# Patient Record
Sex: Male | Born: 1940 | Race: Black or African American | Hispanic: No | Marital: Married | Smoking: Former smoker
Health system: Southern US, Community
[De-identification: ages and names within clinical notes are randomized; demographics above are authoritative.]

## PROBLEM LIST (undated history)

## (undated) DIAGNOSIS — R0600 Dyspnea, unspecified: Secondary | ICD-10-CM

## (undated) DIAGNOSIS — I10 Essential (primary) hypertension: Secondary | ICD-10-CM

## (undated) DIAGNOSIS — M1990 Unspecified osteoarthritis, unspecified site: Secondary | ICD-10-CM

## (undated) HISTORY — PX: HEMORROIDECTOMY: SUR656

---

## 2015-01-21 ENCOUNTER — Other Ambulatory Visit: Payer: Self-pay | Admitting: Internal Medicine

## 2015-01-21 ENCOUNTER — Ambulatory Visit
Admission: RE | Admit: 2015-01-21 | Discharge: 2015-01-21 | Disposition: A | Discharge status: Home / Self Care | Payer: No Typology Code available for payment source | Source: Ambulatory Visit | Attending: Internal Medicine | Admitting: Internal Medicine

## 2015-01-21 DIAGNOSIS — M25562 Pain in left knee: Secondary | ICD-10-CM

## 2016-11-01 ENCOUNTER — Inpatient Hospital Stay (HOSPITAL_COMMUNITY)
Admission: AD | Admit: 2016-11-01 | Discharge: 2016-11-09 | DRG: 247 | Disposition: A | Discharge status: Home / Self Care | Payer: Medicaid Other | Source: Ambulatory Visit | Attending: Cardiovascular Disease | Admitting: Cardiovascular Disease

## 2016-11-01 ENCOUNTER — Inpatient Hospital Stay (HOSPITAL_COMMUNITY): Payer: Medicaid Other

## 2016-11-01 DIAGNOSIS — R071 Chest pain on breathing: Secondary | ICD-10-CM

## 2016-11-01 DIAGNOSIS — J479 Bronchiectasis, uncomplicated: Secondary | ICD-10-CM | POA: Diagnosis present

## 2016-11-01 DIAGNOSIS — I472 Ventricular tachycardia: Secondary | ICD-10-CM | POA: Diagnosis present

## 2016-11-01 DIAGNOSIS — Z87891 Personal history of nicotine dependence: Secondary | ICD-10-CM

## 2016-11-01 DIAGNOSIS — I471 Supraventricular tachycardia: Secondary | ICD-10-CM | POA: Diagnosis present

## 2016-11-01 DIAGNOSIS — I77810 Thoracic aortic ectasia: Secondary | ICD-10-CM | POA: Diagnosis present

## 2016-11-01 DIAGNOSIS — I10 Essential (primary) hypertension: Secondary | ICD-10-CM | POA: Diagnosis present

## 2016-11-01 DIAGNOSIS — I2109 ST elevation (STEMI) myocardial infarction involving other coronary artery of anterior wall: Principal | ICD-10-CM | POA: Diagnosis present

## 2016-11-01 DIAGNOSIS — J84112 Idiopathic pulmonary fibrosis: Secondary | ICD-10-CM | POA: Diagnosis present

## 2016-11-01 DIAGNOSIS — J849 Interstitial pulmonary disease, unspecified: Secondary | ICD-10-CM

## 2016-11-01 DIAGNOSIS — M170 Bilateral primary osteoarthritis of knee: Secondary | ICD-10-CM | POA: Diagnosis present

## 2016-11-01 DIAGNOSIS — D62 Acute posthemorrhagic anemia: Secondary | ICD-10-CM | POA: Diagnosis not present

## 2016-11-01 DIAGNOSIS — I25110 Atherosclerotic heart disease of native coronary artery with unstable angina pectoris: Secondary | ICD-10-CM | POA: Diagnosis present

## 2016-11-01 DIAGNOSIS — I959 Hypotension, unspecified: Secondary | ICD-10-CM | POA: Diagnosis present

## 2016-11-01 DIAGNOSIS — R739 Hyperglycemia, unspecified: Secondary | ICD-10-CM | POA: Diagnosis present

## 2016-11-01 DIAGNOSIS — R079 Chest pain, unspecified: Secondary | ICD-10-CM | POA: Diagnosis present

## 2016-11-01 DIAGNOSIS — Z79899 Other long term (current) drug therapy: Secondary | ICD-10-CM

## 2016-11-01 DIAGNOSIS — D509 Iron deficiency anemia, unspecified: Secondary | ICD-10-CM | POA: Diagnosis present

## 2016-11-01 DIAGNOSIS — E871 Hypo-osmolality and hyponatremia: Secondary | ICD-10-CM | POA: Diagnosis present

## 2016-11-01 DIAGNOSIS — R0602 Shortness of breath: Secondary | ICD-10-CM

## 2016-11-01 DIAGNOSIS — E785 Hyperlipidemia, unspecified: Secondary | ICD-10-CM | POA: Diagnosis present

## 2016-11-01 HISTORY — DX: Unspecified osteoarthritis, unspecified site: M19.90

## 2016-11-01 HISTORY — DX: Essential (primary) hypertension: I10

## 2016-11-01 HISTORY — DX: Dyspnea, unspecified: R06.00

## 2016-11-01 LAB — D-DIMER, QUANTITATIVE (NOT AT ARMC): D DIMER QUANT: 1.2 ug{FEU}/mL — AB (ref 0.00–0.50)

## 2016-11-01 LAB — COMPREHENSIVE METABOLIC PANEL
ALK PHOS: 85 U/L (ref 38–126)
ALT: 10 U/L — AB (ref 17–63)
AST: 16 U/L (ref 15–41)
Albumin: 2.7 g/dL — ABNORMAL LOW (ref 3.5–5.0)
Anion gap: 9 (ref 5–15)
BUN: 34 mg/dL — ABNORMAL HIGH (ref 6–20)
CHLORIDE: 106 mmol/L (ref 101–111)
CO2: 24 mmol/L (ref 22–32)
Calcium: 8.5 mg/dL — ABNORMAL LOW (ref 8.9–10.3)
Creatinine, Ser: 1.36 mg/dL — ABNORMAL HIGH (ref 0.61–1.24)
GFR calc non Af Amer: 49 mL/min — ABNORMAL LOW (ref >60)
GFR, EST AFRICAN AMERICAN: 57 mL/min — AB (ref >60)
Glucose, Bld: 112 mg/dL — ABNORMAL HIGH (ref 65–99)
POTASSIUM: 3.5 mmol/L (ref 3.5–5.1)
SODIUM: 139 mmol/L (ref 135–145)
Total Bilirubin: 0.6 mg/dL (ref 0.3–1.2)
Total Protein: 6.3 g/dL — ABNORMAL LOW (ref 6.5–8.1)

## 2016-11-01 LAB — TROPONIN I

## 2016-11-01 MED ORDER — GEMFIBROZIL 600 MG PO TABS
600.0000 mg | ORAL_TABLET | Freq: Two times a day (BID) | ORAL | Status: DC
Start: 2016-11-02 — End: 2016-11-09
  Administered 2016-11-02 – 2016-11-09 (×14): 600 mg via ORAL
  Filled 2016-11-01 (×17): qty 1

## 2016-11-01 MED ORDER — TAMSULOSIN HCL 0.4 MG PO CAPS
0.4000 mg | ORAL_CAPSULE | Freq: Every day | ORAL | Status: DC
  Administered 2016-11-02 – 2016-11-07 (×5): 0.4 mg via ORAL
  Filled 2016-11-01 (×5): qty 1

## 2016-11-01 MED ORDER — HEPARIN (PORCINE) IN NACL 100-0.45 UNIT/ML-% IJ SOLN
1350.0000 [IU]/h | INTRAMUSCULAR | Status: DC
  Administered 2016-11-01: 1200 [IU]/h via INTRAVENOUS
  Administered 2016-11-02 – 2016-11-04 (×3): 1350 [IU]/h via INTRAVENOUS
  Filled 2016-11-01 (×4): qty 250

## 2016-11-01 MED ORDER — AMLODIPINE BESYLATE 10 MG PO TABS
10.0000 mg | ORAL_TABLET | Freq: Every day | ORAL | Status: DC
  Administered 2016-11-02 – 2016-11-03 (×2): 10 mg via ORAL
  Filled 2016-11-01 (×2): qty 1

## 2016-11-01 MED ORDER — NITROGLYCERIN 0.4 MG SL SUBL: 0.4000 mg | SUBLINGUAL_TABLET | SUBLINGUAL | Status: DC | PRN

## 2016-11-01 MED ORDER — ASPIRIN EC 81 MG PO TBEC
81.0000 mg | DELAYED_RELEASE_TABLET | Freq: Every day | ORAL | Status: DC
Start: 2016-11-02 — End: 2016-11-09
  Administered 2016-11-02 – 2016-11-09 (×8): 81 mg via ORAL
  Filled 2016-11-01 (×8): qty 1

## 2016-11-01 MED ORDER — ASPIRIN 81 MG PO CHEW
324.0000 mg | CHEWABLE_TABLET | ORAL | Status: AC
  Administered 2016-11-01: 324 mg via ORAL
  Filled 2016-11-01: qty 4

## 2016-11-01 MED ORDER — ACETAMINOPHEN 325 MG PO TABS
650.0000 mg | ORAL_TABLET | ORAL | Status: DC | PRN
  Administered 2016-11-02 – 2016-11-07 (×4): 650 mg via ORAL
  Filled 2016-11-01 (×4): qty 2

## 2016-11-01 MED ORDER — ONDANSETRON HCL 4 MG/2ML IJ SOLN
4.0000 mg | Freq: Four times a day (QID) | INTRAMUSCULAR | Status: DC | PRN
Start: 2016-11-01 — End: 2016-11-09

## 2016-11-01 MED ORDER — HEPARIN BOLUS VIA INFUSION
4000.0000 [IU] | Freq: Once | INTRAVENOUS | Status: AC
  Administered 2016-11-01: 4000 [IU] via INTRAVENOUS
  Filled 2016-11-01: qty 4000

## 2016-11-01 MED ORDER — GUAIFENESIN-DM 100-10 MG/5ML PO SYRP
5.0000 mL | ORAL_SOLUTION | ORAL | Status: DC | PRN
  Administered 2016-11-01 – 2016-11-08 (×20): 5 mL via ORAL
  Filled 2016-11-01 (×21): qty 5

## 2016-11-01 MED ORDER — ASPIRIN 300 MG RE SUPP
300.0000 mg | RECTAL | Status: AC
  Filled 2016-11-01: qty 1

## 2016-11-01 NOTE — H&P (Signed)
Referring Physician:  [name] is an 76 y.o. male.                       Chief Complaint: Chest pain and shortness of breath  HPI: 76 year old male has 2 month history of recurrent chest pain and shortness of breath on exertion. He was hypoxic on exertion in office with no fever but positive cough. He has remote history of tobacco use. He chewed tobbaco for 30 years and smoked cigarettes for 3-4 years, then quit tobacco use for last 10 years. He also has bilateral knee pain with known history of severe arthritis.  Past medical history: No diabetes mellitus, Positive Hypertension x 10 years. No MI. No stroke. No asthma. Positive bilateral knee arthritis, can not afford surgery due to lack of insurance.  Past surgical history: Left knee cyst removal from back many years ago.  Family history: Parents died of old age. He had 6 brothers and lost 5, one younger brother is living. He had one sister, not living.   Social History:  Married, has quit tobacco x 10 years. Had tobacco chewing x 30 years and smoking cigarettes x 3-4 years. No drug history.  Allergies: No Known Allergies  Medications Prior to Admission  Medication Sig Dispense Refill  . amLODipine (NORVASC) 10 MG tablet Take 10 mg by mouth daily.  2  . diclofenac (VOLTAREN) 75 MG EC tablet Take 75 mg by mouth 2 (two) times daily.  0  . gemfibrozil (LOPID) 600 MG tablet TAKE 1 TABLET BY MOUTH 2 TIMES DAILY 30 MINUTES BEFORE morning and evening meal  0  . tamsulosin (FLOMAX) 0.4 MG CAPS capsule Take 0.4 mg by mouth daily.  0    No results found for this or any previous visit (from the past 48 hour(s)). No results found.  Review Of Systems Constitutional: No fever, chills, weight loss or gain. Eyes: No vision change, wears glasses. No discharge or pain. Ears: No hearing loss, No tinnitus. Respiratory: No asthma, COPD, pneumonias. Positive shortness of breath. No hemoptysis. Cardiovascular: Positive chest pain, no palpitation, no  leg edema. Gastrointestinal: No nausea, vomiting, diarrhea, constipation. No GI bleed. No hepatitis. Genitourinary: No dysuria, hematuria, kidney stone. No incontinance. Neurological: No headache, stroke, seizures.  Psychiatry: No psych facility admission for anxiety, depression, suicide. No detox. Skin: No rash. Musculoskeletal: Positive joint pain, no fibromyalgia. No neck pain, back pain. Lymphadenopathy: No lymphadenopathy. Hematology: No anemia or easy bruising.   There were no vitals taken for this visit. There is no height or weight on file to calculate BMI. General appearance: alert, cooperative, appears stated age and no distress Head: Normocephalic, atraumatic. Eyes: Brown eyes, pink conjunctiva, corneas clear but lens are hazy bilateral. PERRL, EOM's intact. Neck: No adenopathy, no carotid bruit, no JVD, supple, symmetrical, trachea midline and thyroid not enlarged. Resp: Fine crackles to auscultation bilaterally. Cardio: Regular rate and rhythm, S1, S2 normal, II/VI systolic murmur, no click, rub or gallop GI: Soft, non-tender; bowel sounds normal; no organomegaly. Extremities: No edema, cyanosis or clubbing. Positive bilateral knee swelling with negative Drawer test.  Skin: Warm and dry.  Neurologic: Alert and oriented X 3, normal strength. Normal coordination and slow gait with a cane due to knee pain.  Assessment/Plan Chest pain R/O MI Shortness of breath on exertion r/o PE Cough r/o Pneumonia Hypertension Bilateral knee arthritis Dyslipidemia  Admit Troponin-I, D-dimer. CT angio chest.  Norman Rodriguez, MD  11/01/2016, 8:52 PM

## 2016-11-01 NOTE — Progress Notes (Addendum)
Paged MD to receive orders. Admission nurse called.

## 2016-11-01 NOTE — Progress Notes (Signed)
ANTICOAGULATION CONSULT NOTE - Initial Consult  Pharmacy Consult for heparin  Indication: chest pain/ACS and r/o PE  No Known Allergies  Patient Measurements: Height:  (165.1 cm) Weight: 185 lb 9.6 oz (84.2 kg) (a scale) IBW/kg (Calculated) : 61.5 Heparin Dosing Weight: 79 kg  Vital Signs: Temp: 98 F (36.7 C) (04/09 2105) Temp Source: Oral (04/09 2105) BP: 110/67 (04/09 2105) Pulse Rate: 65 (04/09 2105)  Labs: No results for input(s): HGB, HCT, PLT, APTT, LABPROT, INR, HEPARINUNFRC, HEPRLOWMOCWT, CREATININE, CKTOTAL, CKMB, TROPONINI in the last 72 hours.  CrCl cannot be calculated (No order found.).   Medical History: No past medical history on file.   Assessment: 76 yo male admitted with chest pain and SOB. Workup for ACS and PE in progress and to start heparin. No known a/c prior to arrival. CBC pending.   Goal of Therapy:  Heparin level 0.3-0.7 units/ml Monitor platelets by anticoagulation protocol: Yes   Plan:  Give 4000 units bolus x 1 Start heparin infusion at 1200 units/hr Check anti-Xa level in 8 hours and daily while on heparin Continue to monitor H&H and platelets  Pollyann Samples, PharmD, BCPS 11/01/2016, 9:28 PM

## 2016-11-01 NOTE — Progress Notes (Signed)
MD will be on the unit "in about "

## 2016-11-02 ENCOUNTER — Encounter (HOSPITAL_COMMUNITY): Payer: Self-pay | Admitting: General Practice

## 2016-11-02 ENCOUNTER — Inpatient Hospital Stay (HOSPITAL_COMMUNITY): Payer: Medicaid Other

## 2016-11-02 LAB — BASIC METABOLIC PANEL
Anion gap: 9 (ref 5–15)
BUN: 30 mg/dL — AB (ref 6–20)
CALCIUM: 8.6 mg/dL — AB (ref 8.9–10.3)
CO2: 23 mmol/L (ref 22–32)
CREATININE: 1.14 mg/dL (ref 0.61–1.24)
Chloride: 107 mmol/L (ref 101–111)
GFR calc Af Amer: >60 mL/min (ref >60)
GLUCOSE: 102 mg/dL — AB (ref 65–99)
Potassium: 3.9 mmol/L (ref 3.5–5.1)
Sodium: 139 mmol/L (ref 135–145)

## 2016-11-02 LAB — CBC
HCT: 35.5 % — ABNORMAL LOW (ref 39.0–52.0)
HEMOGLOBIN: 11.5 g/dL — AB (ref 13.0–17.0)
MCH: 24.3 pg — AB (ref 26.0–34.0)
MCHC: 32.4 g/dL (ref 30.0–36.0)
MCV: 74.9 fL — ABNORMAL LOW (ref 78.0–100.0)
Platelets: 412 10*3/uL — ABNORMAL HIGH (ref 150–400)
RBC: 4.74 MIL/uL (ref 4.22–5.81)
RDW: 14.9 % (ref 11.5–15.5)
WBC: 8.7 10*3/uL (ref 4.0–10.5)

## 2016-11-02 LAB — PROTIME-INR
INR: 1.14
PROTHROMBIN TIME: 14.6 s (ref 11.4–15.2)

## 2016-11-02 LAB — LIPID PANEL
Cholesterol: 158 mg/dL (ref 0–200)
HDL: 27 mg/dL — ABNORMAL LOW (ref >40)
LDL CALC: 115 mg/dL — AB (ref 0–99)
TRIGLYCERIDES: 81 mg/dL (ref <150)
Total CHOL/HDL Ratio: 5.9 RATIO
VLDL: 16 mg/dL (ref 0–40)

## 2016-11-02 LAB — TROPONIN I: Troponin I: <0.03 ng/mL (ref <0.03)

## 2016-11-02 LAB — HEPARIN LEVEL (UNFRACTIONATED): Heparin Unfractionated: 0.26 IU/mL — ABNORMAL LOW (ref 0.30–0.70)

## 2016-11-02 MED ORDER — IOPAMIDOL (ISOVUE-370) INJECTION 76%
INTRAVENOUS | Status: AC
  Administered 2016-11-02: 100 mL
  Filled 2016-11-02: qty 100

## 2016-11-02 MED ORDER — IPRATROPIUM-ALBUTEROL 0.5-2.5 (3) MG/3ML IN SOLN
3.0000 mL | Freq: Two times a day (BID) | RESPIRATORY_TRACT | Status: DC
Start: 2016-11-02 — End: 2016-11-03
  Administered 2016-11-02: 3 mL via RESPIRATORY_TRACT
  Filled 2016-11-02: qty 3

## 2016-11-02 MED ORDER — IPRATROPIUM-ALBUTEROL 0.5-2.5 (3) MG/3ML IN SOLN: 3.0000 mL | Freq: Four times a day (QID) | RESPIRATORY_TRACT | Status: DC

## 2016-11-02 NOTE — Progress Notes (Signed)
Patient complains of blurred vision, feels ok otherwise, no pain currently

## 2016-11-02 NOTE — Progress Notes (Signed)
Ref: EDWARDS, MICHELLE P, NP   Subjective:  Patient and son aware of CT of chest negative for PE but positive for chronic interstitial lung disease.  Objective:  Vital Signs in the last 24 hours: Temp:  [97.5 F (36.4 C)-98 F (36.7 C)] 97.5 F (36.4 C) (04/10 0115) Pulse Rate:  [65-75] 75 (04/10 0904) Cardiac Rhythm: Normal sinus rhythm;Bundle branch block (04/10 0720) Resp:  [16-18] 18 (04/10 0904) BP: (110-124)/(67-81) 124/81 (04/10 0904) SpO2:  [95 %-100 %] 97 % (04/10 0904) Weight:  [84.2 kg (185 lb 9.6 oz)-84.3 kg (185 lb 14.4 oz)] 84.3 kg (185 lb 14.4 oz) (04/10 1610)  Physical Exam: BP Readings from Last 1 Encounters:  11/02/16 124/81    Wt Readings from Last 1 Encounters:  11/02/16 84.3 kg (185 lb 14.4 oz)    Weight change:  Body mass index is 30.94 kg/m. HEENT: [place]/AT, Eyes-Brown, PERL, EOMI, Conjunctiva-Pink, Sclera-Non-icteric Neck: No JVD, No bruit, Trachea midline. Lungs:  Fine crackles, Bilateral. Cardiac:  Regular rhythm, normal S1 and S2, no S3. II/VI systolic murmur. Abdomen:  Soft, non-tender. BS present. Extremities:  No edema present. No cyanosis. No clubbing. Bilateral knee pain CNS: AxOx3, Cranial nerves grossly intact, moves all 4 extremities.  Skin: Warm and dry.   Intake/Output from previous day: 04/09 0701 - 04/10 0700 In: 534.4 [P.O.:440; I.V.:94.4] Out: 775 [Urine:775]    Lab Results: BMET    Component Value Date/Time   NA 139 11/02/2016 0819   NA 139 11/01/2016 2100   K 3.9 11/02/2016 0819   K 3.5 11/01/2016 2100   CL 107 11/02/2016 0819   CL 106 11/01/2016 2100   CO2 23 11/02/2016 0819   CO2 24 11/01/2016 2100   GLUCOSE 102 (H) 11/02/2016 0819   GLUCOSE 112 (H) 11/01/2016 2100   BUN 30 (H) 11/02/2016 0819   BUN 34 (H) 11/01/2016 2100   CREATININE 1.14 11/02/2016 0819   CREATININE 1.36 (H) 11/01/2016 2100   CALCIUM 8.6 (L) 11/02/2016 0819   CALCIUM 8.5 (L) 11/01/2016 2100   GFRNONAA >60 11/02/2016 0819   GFRNONAA 49 (L)  11/01/2016 2100   GFRAA >60 11/02/2016 0819   GFRAA 57 (L) 11/01/2016 2100   CBC    Component Value Date/Time   WBC 8.7 11/02/2016 0819   RBC 4.74 11/02/2016 0819   HGB 11.5 (L) 11/02/2016 0819   HCT 35.5 (L) 11/02/2016 0819   PLT 412 (H) 11/02/2016 0819   MCV 74.9 (L) 11/02/2016 0819   MCH 24.3 (L) 11/02/2016 0819   MCHC 32.4 11/02/2016 0819   RDW 14.9 11/02/2016 0819   HEPATIC Function Panel  Recent Labs  11/01/16 2100  PROT 6.3*   HEMOGLOBIN A1C No components found for: HGA1C,  MPG CARDIAC ENZYMES Lab Results  Component Value Date   TROPONINI <0.03 11/02/2016   TROPONINI <0.03 11/02/2016   TROPONINI <0.03 11/01/2016   BNP No results for input(s): PROBNP in the last 8760 hours. TSH No results for input(s): TSH in the last 8760 hours. CHOLESTEROL  Recent Labs  11/02/16 0335  CHOL 158    Scheduled Meds: . amLODipine  10 mg Oral Daily  . aspirin EC  81 mg Oral Daily  . gemfibrozil  600 mg Oral BID AC  . ipratropium-albuterol  3 mL Inhalation BID  . tamsulosin  0.4 mg Oral Daily   Continuous Infusions: . heparin 1,350 Units/hr (11/02/16 0910)   PRN Meds:.acetaminophen, guaiFENesin-dextromethorphan, nitroGLYCERIN, ondansetron (ZOFRAN) IV  Assessment/Plan: Chest pain Shortness of breath Chronic cough Interstitial  lung disease Hypertension Bulateral knee arhtritis Dyslipidemia  Nuclear stress test in AM Start Duo-neb treatments. May need PFTS.   LOS: 1 day    Orpah Cobb  MD  11/02/2016, 10:24 AM

## 2016-11-02 NOTE — Progress Notes (Signed)
Paged Dr. Algie Coffer regarding pt request for medication for arthritis.

## 2016-11-02 NOTE — Progress Notes (Signed)
ANTICOAGULATION CONSULT NOTE  Pharmacy Consult for heparin  Indication: chest pain/ACS and r/o PE  No Known Allergies   Labs:  Recent Labs  11/01/16 2100 11/02/16 0335 11/02/16 0647 11/02/16 0819  HGB  --   --   --  11.5*  HCT  --   --   --  35.5*  PLT  --   --   --  412*  HEPARINUNFRC  --   --  0.26*  --   CREATININE 1.36*  --   --   --   TROPONINI <0.03 <0.03  --   --      Assessment: 76 yo male admitted with chest pain and SOB.  CT negative for PE, troponins negative Heparin level 0.26 No bleeding noted  Goal of Therapy:  Heparin level 0.3-0.7 units/ml Monitor platelets by anticoagulation protocol: Yes   Plan:  Increase heparin to 1350 units / hr Follow up AM labs  Thank you Okey Regal, PharmD [phone]  11/02/2016, 8:47 AM

## 2016-11-02 NOTE — Progress Notes (Signed)
Patient speaks Arabic. Son helps with language barrier, patient does well with interpreter via phone.

## 2016-11-03 ENCOUNTER — Inpatient Hospital Stay (HOSPITAL_COMMUNITY): Payer: Medicaid Other

## 2016-11-03 LAB — HEPARIN LEVEL (UNFRACTIONATED): HEPARIN UNFRACTIONATED: 0.37 [IU]/mL (ref 0.30–0.70)

## 2016-11-03 MED ORDER — TECHNETIUM TC 99M TETROFOSMIN IV KIT
30.0000 | PACK | Freq: Once | INTRAVENOUS | Status: AC | PRN
  Administered 2016-11-03: 30 via INTRAVENOUS

## 2016-11-03 MED ORDER — SODIUM CHLORIDE 0.9 % IV SOLN
INTRAVENOUS | Status: DC
  Administered 2016-11-04: 06:00:00 via INTRAVENOUS

## 2016-11-03 MED ORDER — TECHNETIUM TC 99M TETROFOSMIN IV KIT
10.0000 | PACK | Freq: Once | INTRAVENOUS | Status: AC | PRN
  Administered 2016-11-03: 10 via INTRAVENOUS

## 2016-11-03 MED ORDER — IPRATROPIUM-ALBUTEROL 0.5-2.5 (3) MG/3ML IN SOLN: 3.0000 mL | RESPIRATORY_TRACT | Status: DC | PRN

## 2016-11-03 MED ORDER — SODIUM CHLORIDE 0.9% FLUSH: 3.0000 mL | INTRAVENOUS | Status: DC | PRN

## 2016-11-03 MED ORDER — SODIUM CHLORIDE 0.9% FLUSH
3.0000 mL | Freq: Two times a day (BID) | INTRAVENOUS | Status: DC
  Administered 2016-11-03: 3 mL via INTRAVENOUS

## 2016-11-03 MED ORDER — SODIUM CHLORIDE 0.9 % IV SOLN: 250.0000 mL | INTRAVENOUS | Status: DC | PRN

## 2016-11-03 MED ORDER — REGADENOSON 0.4 MG/5ML IV SOLN
0.4000 mg | Freq: Once | INTRAVENOUS | Status: AC
  Administered 2016-11-03: 0.4 mg via INTRAVENOUS
  Filled 2016-11-03: qty 5

## 2016-11-03 MED ORDER — REGADENOSON 0.4 MG/5ML IV SOLN
INTRAVENOUS | Status: AC
  Administered 2016-11-03: 0.4 mg via INTRAVENOUS
  Filled 2016-11-03: qty 5

## 2016-11-03 NOTE — Progress Notes (Signed)
Pt is alert and oriented with Family Support at Bedside at the change of Shift. Reported an intermittently cough with gave PRN anotherr with a tylenol. Effective with decreased and Sleep.

## 2016-11-03 NOTE — Progress Notes (Signed)
Pt has urinated twice over night, without measurement.

## 2016-11-03 NOTE — Progress Notes (Signed)
Pt is alert and oriented vitals stable had a restful night, NPO since MN for Stress test.

## 2016-11-03 NOTE — Progress Notes (Signed)
Ref: EDWARDS, Norman P, NP   Subjective:  Abnormal nuclear stress test with inferior wall ischemia. Patient understood need for cardiac catheterization using arabic interpreter via audi-video service. VS stable. Positive cough.  Objective:  Vital Signs in the last 24 hours: Temp:  [97.3 F (36.3 C)-98.3 F (36.8 C)] 97.3 F (36.3 C) (04/11 1144) Pulse Rate:  [73-100] 100 (04/11 1144) Cardiac Rhythm: Normal sinus rhythm (04/11 0713) Resp:  [18] 18 (04/11 1144) BP: (117-154)/(66-94) 138/83 (04/11 1144) SpO2:  [93 %-98 %] 98 % (04/11 1144) Weight:  [83.5 kg (184 lb 1.6 oz)] 83.5 kg (184 lb 1.6 oz) (04/11 1610)  Physical Exam: BP Readings from Last 1 Encounters:  11/03/16 138/83    Wt Readings from Last 1 Encounters:  11/03/16 83.5 kg (184 lb 1.6 oz)    Weight change: -0.68 kg (-1 lb 8 oz) Body mass index is 30.64 kg/m. HEENT: [place]/AT, Eyes-Brown, PERL, EOMI, Conjunctiva-Pink, Sclera-Non-icteric Neck: No JVD, No bruit, Trachea midline. Lungs:  Fine crackles, Bilateral. Cardiac:  Regular rhythm, normal S1 and S2, no S3. II/VI systolic murmur. Abdomen:  Soft, non-tender. BS present. Extremities:  No edema present. No cyanosis. No clubbing. CNS: AxOx3, Cranial nerves grossly intact, moves all 4 extremities.  Skin: Warm and dry.   Intake/Output from previous day: 04/10 0701 - 04/11 0700 In: 907.9 [Baker.O.:700; I.V.:207.9] Out: 300 [Urine:300]    Lab Results: BMET    Component Value Date/Time   NA 139 11/02/2016 0819   NA 139 11/01/2016 2100   K 3.9 11/02/2016 0819   K 3.5 11/01/2016 2100   CL 107 11/02/2016 0819   CL 106 11/01/2016 2100   CO2 23 11/02/2016 0819   CO2 24 11/01/2016 2100   GLUCOSE 102 (H) 11/02/2016 0819   GLUCOSE 112 (H) 11/01/2016 2100   BUN 30 (H) 11/02/2016 0819   BUN 34 (H) 11/01/2016 2100   CREATININE 1.14 11/02/2016 0819   CREATININE 1.36 (H) 11/01/2016 2100   CALCIUM 8.6 (L) 11/02/2016 0819   CALCIUM 8.5 (L) 11/01/2016 2100   GFRNONAA >60  11/02/2016 0819   GFRNONAA 49 (L) 11/01/2016 2100   GFRAA >60 11/02/2016 0819   GFRAA 57 (L) 11/01/2016 2100   CBC    Component Value Date/Time   WBC 8.7 11/02/2016 0819   RBC 4.74 11/02/2016 0819   HGB 11.5 (L) 11/02/2016 0819   HCT 35.5 (L) 11/02/2016 0819   PLT 412 (H) 11/02/2016 0819   MCV 74.9 (L) 11/02/2016 0819   MCH 24.3 (L) 11/02/2016 0819   MCHC 32.4 11/02/2016 0819   RDW 14.9 11/02/2016 0819   HEPATIC Function Panel  Recent Labs  11/01/16 2100  PROT 6.3*   HEMOGLOBIN A1C No components found for: HGA1C,  MPG CARDIAC ENZYMES Lab Results  Component Value Date   TROPONINI <0.03 11/02/2016   TROPONINI <0.03 11/02/2016   TROPONINI <0.03 11/01/2016   BNP No results for input(s): PROBNP in the last 8760 hours. TSH No results for input(s): TSH in the last 8760 hours. CHOLESTEROL  Recent Labs  11/02/16 0335  CHOL 158    Scheduled Meds: . amLODipine  10 mg Oral Daily  . aspirin EC  81 mg Oral Daily  . gemfibrozil  600 mg Oral BID AC  . tamsulosin  0.4 mg Oral Daily   Continuous Infusions: . heparin 1,350 Units/hr (11/03/16 1515)   PRN Meds:.acetaminophen, guaiFENesin-dextromethorphan, ipratropium-albuterol, nitroGLYCERIN, ondansetron (ZOFRAN) IV  Assessment/Plan: Chest pain CAD Shortness of breath Chronic cough Interstitial lung disease Hypertension Bilateral knee  arthritis Dyslipidemia  Echocardiogram pending. Right and left heart catheterization in AM. Left message with son's phone to all me back.    LOS: 2 days    Crislyn Willbanks  MD  11/03/2016, 7:02 PM     

## 2016-11-03 NOTE — Progress Notes (Signed)
ANTICOAGULATION CONSULT NOTE  Pharmacy Consult for heparin  Indication: chest pain/ACS and r/o PE  No Known Allergies   Labs:  Recent Labs  11/01/16 2100 11/02/16 0335 11/02/16 0647 11/02/16 0819 11/03/16 0259  HGB  --   --   --  11.5*  --   HCT  --   --   --  35.5*  --   PLT  --   --   --  412*  --   LABPROT  --   --   --  14.6  --   INR  --   --   --  1.14  --   HEPARINUNFRC  --   --  0.26*  --  0.37  CREATININE 1.36*  --   --  1.14  --   TROPONINI <0.03 <0.03  --  <0.03  --      Assessment: 76 yo male admitted with chest pain and SOB.  CT negative for PE, troponins negative Heparin level 0.37 No bleeding noted  Goal of Therapy:  Heparin level 0.3-0.7 units/ml Monitor platelets by anticoagulation protocol: Yes   Plan:  Continue heparin at 1350 units / hr Follow up AM labs  Thank you Okey Regal, PharmD [phone]  11/03/2016, 8:18 AM

## 2016-11-04 ENCOUNTER — Encounter (HOSPITAL_COMMUNITY): Payer: Self-pay | Admitting: Cardiovascular Disease

## 2016-11-04 ENCOUNTER — Other Ambulatory Visit (HOSPITAL_COMMUNITY): Payer: Self-pay

## 2016-11-04 ENCOUNTER — Encounter (HOSPITAL_COMMUNITY)
Admission: AD | Disposition: A | Discharge status: Home / Self Care | Payer: Self-pay | Source: Ambulatory Visit | Attending: Cardiovascular Disease

## 2016-11-04 HISTORY — PX: CORONARY STENT INTERVENTION: CATH118234

## 2016-11-04 HISTORY — PX: RIGHT/LEFT HEART CATH AND CORONARY ANGIOGRAPHY: CATH118266

## 2016-11-04 LAB — POCT I-STAT 3, VENOUS BLOOD GAS (G3P V)
Acid-base deficit: 2 mmol/L (ref 0.0–2.0)
BICARBONATE: 23.4 mmol/L (ref 20.0–28.0)
O2 Saturation: 51 %
PH VEN: 7.384 (ref 7.250–7.430)
PO2 VEN: 28 mmHg — AB (ref 32.0–45.0)
TCO2: 25 mmol/L (ref 0–100)
pCO2, Ven: 39.2 mmHg — ABNORMAL LOW (ref 44.0–60.0)

## 2016-11-04 LAB — BASIC METABOLIC PANEL
Anion gap: 8 (ref 5–15)
BUN: 23 mg/dL — ABNORMAL HIGH (ref 6–20)
CALCIUM: 8.5 mg/dL — AB (ref 8.9–10.3)
CO2: 23 mmol/L (ref 22–32)
CREATININE: 1.05 mg/dL (ref 0.61–1.24)
Chloride: 107 mmol/L (ref 101–111)
GFR calc Af Amer: >60 mL/min (ref >60)
GFR calc non Af Amer: >60 mL/min (ref >60)
GLUCOSE: 129 mg/dL — AB (ref 65–99)
Potassium: 3.7 mmol/L (ref 3.5–5.1)
Sodium: 138 mmol/L (ref 135–145)

## 2016-11-04 LAB — CBC
HCT: 33.7 % — ABNORMAL LOW (ref 39.0–52.0)
Hemoglobin: 10.8 g/dL — ABNORMAL LOW (ref 13.0–17.0)
MCH: 24.2 pg — AB (ref 26.0–34.0)
MCHC: 32 g/dL (ref 30.0–36.0)
MCV: 75.4 fL — ABNORMAL LOW (ref 78.0–100.0)
PLATELETS: 375 10*3/uL (ref 150–400)
RBC: 4.47 MIL/uL (ref 4.22–5.81)
RDW: 14.8 % (ref 11.5–15.5)
WBC: 11.4 10*3/uL — ABNORMAL HIGH (ref 4.0–10.5)

## 2016-11-04 LAB — POCT I-STAT 3, ART BLOOD GAS (G3+)
Acid-base deficit: 2 mmol/L (ref 0.0–2.0)
Bicarbonate: 22.5 mmol/L (ref 20.0–28.0)
O2 SAT: 88 %
PCO2 ART: 35.6 mmHg (ref 32.0–48.0)
PO2 ART: 54 mmHg — AB (ref 83.0–108.0)
TCO2: 24 mmol/L (ref 0–100)
pH, Arterial: 7.409 (ref 7.350–7.450)

## 2016-11-04 LAB — PROTIME-INR
INR: 1.15
PROTHROMBIN TIME: 14.7 s (ref 11.4–15.2)

## 2016-11-04 LAB — POCT ACTIVATED CLOTTING TIME: ACTIVATED CLOTTING TIME: 389 s

## 2016-11-04 LAB — TROPONIN I: Troponin I: 61.86 ng/mL (ref <0.03)

## 2016-11-04 LAB — HEPARIN LEVEL (UNFRACTIONATED): Heparin Unfractionated: 0.47 IU/mL (ref 0.30–0.70)

## 2016-11-04 LAB — MRSA PCR SCREENING: MRSA BY PCR: NEGATIVE

## 2016-11-04 SURGERY — RIGHT/LEFT HEART CATH AND CORONARY ANGIOGRAPHY
Anesthesia: LOCAL

## 2016-11-04 MED ORDER — FUROSEMIDE 10 MG/ML IJ SOLN
INTRAMUSCULAR | Status: DC | PRN
  Administered 2016-11-04: 40 mg via INTRAVENOUS

## 2016-11-04 MED ORDER — NITROGLYCERIN 1 MG/10 ML FOR IR/CATH LAB
INTRA_ARTERIAL | Status: AC
  Filled 2016-11-04: qty 10

## 2016-11-04 MED ORDER — TIROFIBAN HCL IV 12.5 MG/250 ML: 0.0750 ug/kg/min | INTRAVENOUS | Status: AC

## 2016-11-04 MED ORDER — IOPAMIDOL (ISOVUE-370) INJECTION 76%
INTRAVENOUS | Status: AC
  Filled 2016-11-04: qty 100

## 2016-11-04 MED ORDER — ASPIRIN 81 MG PO CHEW: 81.0000 mg | CHEWABLE_TABLET | Freq: Every day | ORAL | Status: DC

## 2016-11-04 MED ORDER — HEPARIN (PORCINE) IN NACL 2-0.9 UNIT/ML-% IJ SOLN
INTRAMUSCULAR | Status: AC
  Filled 2016-11-04: qty 1000

## 2016-11-04 MED ORDER — NITROGLYCERIN IN D5W 200-5 MCG/ML-% IV SOLN
INTRAVENOUS | Status: AC
  Filled 2016-11-04: qty 250

## 2016-11-04 MED ORDER — SODIUM CHLORIDE 0.9% FLUSH: 3.0000 mL | INTRAVENOUS | Status: DC | PRN

## 2016-11-04 MED ORDER — SODIUM CHLORIDE 0.9% FLUSH
3.0000 mL | INTRAVENOUS | Status: DC | PRN
  Administered 2016-11-05: 3 mL via INTRAVENOUS
  Filled 2016-11-04: qty 3

## 2016-11-04 MED ORDER — VERAPAMIL HCL 2.5 MG/ML IV SOLN
INTRAVENOUS | Status: AC
  Filled 2016-11-04: qty 2

## 2016-11-04 MED ORDER — LIDOCAINE HCL (PF) 1 % IJ SOLN
INTRAMUSCULAR | Status: AC
  Filled 2016-11-04: qty 30

## 2016-11-04 MED ORDER — SODIUM CHLORIDE 0.9 % IV SOLN: 250.0000 mL | INTRAVENOUS | Status: DC | PRN

## 2016-11-04 MED ORDER — HEPARIN (PORCINE) IN NACL 2-0.9 UNIT/ML-% IJ SOLN
INTRAMUSCULAR | Status: DC | PRN
  Administered 2016-11-04: 1000 mL

## 2016-11-04 MED ORDER — PANTOPRAZOLE SODIUM 40 MG PO TBEC
40.0000 mg | DELAYED_RELEASE_TABLET | Freq: Every day | ORAL | Status: DC
  Administered 2016-11-05 – 2016-11-09 (×5): 40 mg via ORAL
  Filled 2016-11-04 (×6): qty 1

## 2016-11-04 MED ORDER — TICAGRELOR 90 MG PO TABS
90.0000 mg | ORAL_TABLET | Freq: Two times a day (BID) | ORAL | Status: DC
  Administered 2016-11-04 – 2016-11-09 (×10): 90 mg via ORAL
  Filled 2016-11-04 (×11): qty 1

## 2016-11-04 MED ORDER — SODIUM CHLORIDE 0.9% FLUSH
3.0000 mL | Freq: Two times a day (BID) | INTRAVENOUS | Status: DC
  Administered 2016-11-05: 3 mL via INTRAVENOUS

## 2016-11-04 MED ORDER — FENTANYL CITRATE (PF) 100 MCG/2ML IJ SOLN
INTRAMUSCULAR | Status: AC
  Filled 2016-11-04: qty 2

## 2016-11-04 MED ORDER — SODIUM CHLORIDE 0.9 % IV SOLN: INTRAVENOUS | Status: AC

## 2016-11-04 MED ORDER — TICAGRELOR 90 MG PO TABS
ORAL_TABLET | ORAL | Status: AC
  Filled 2016-11-04: qty 2

## 2016-11-04 MED ORDER — HEPARIN (PORCINE) IN NACL 2-0.9 UNIT/ML-% IJ SOLN
INTRAMUSCULAR | Status: AC
  Filled 2016-11-04: qty 500

## 2016-11-04 MED ORDER — LOSARTAN POTASSIUM 25 MG PO TABS
25.0000 mg | ORAL_TABLET | Freq: Every day | ORAL | Status: DC
  Administered 2016-11-05 – 2016-11-09 (×5): 25 mg via ORAL
  Filled 2016-11-04 (×5): qty 1

## 2016-11-04 MED ORDER — TIROFIBAN (AGGRASTAT) BOLUS VIA INFUSION
INTRAVENOUS | Status: DC | PRN
  Administered 2016-11-04: 2100 ug via INTRAVENOUS

## 2016-11-04 MED ORDER — MIDAZOLAM HCL 2 MG/2ML IJ SOLN
INTRAMUSCULAR | Status: DC | PRN
  Administered 2016-11-04 (×2): 1 mg via INTRAVENOUS

## 2016-11-04 MED ORDER — BIVALIRUDIN 250 MG IV SOLR
INTRAVENOUS | Status: AC
  Filled 2016-11-04: qty 250

## 2016-11-04 MED ORDER — METOPROLOL TARTRATE 12.5 MG HALF TABLET
12.5000 mg | ORAL_TABLET | Freq: Two times a day (BID) | ORAL | Status: DC
  Administered 2016-11-04 – 2016-11-09 (×10): 12.5 mg via ORAL
  Filled 2016-11-04 (×11): qty 1

## 2016-11-04 MED ORDER — SODIUM CHLORIDE 0.9 % IV SOLN
INTRAVENOUS | Status: DC | PRN
  Administered 2016-11-04: 1.75 mg/kg/h via INTRAVENOUS

## 2016-11-04 MED ORDER — NITROGLYCERIN 1 MG/10 ML FOR IR/CATH LAB
INTRA_ARTERIAL | Status: DC | PRN
  Administered 2016-11-04 (×3): 150 ug via INTRACORONARY

## 2016-11-04 MED ORDER — MIDAZOLAM HCL 2 MG/2ML IJ SOLN
INTRAMUSCULAR | Status: AC
  Filled 2016-11-04: qty 2

## 2016-11-04 MED ORDER — NITROGLYCERIN IN D5W 200-5 MCG/ML-% IV SOLN: INTRAVENOUS | Status: DC | PRN

## 2016-11-04 MED ORDER — IOPAMIDOL (ISOVUE-370) INJECTION 76%
INTRAVENOUS | Status: AC
  Filled 2016-11-04: qty 50

## 2016-11-04 MED ORDER — LIDOCAINE HCL (PF) 1 % IJ SOLN
INTRAMUSCULAR | Status: DC | PRN
  Administered 2016-11-04: 20 mL

## 2016-11-04 MED ORDER — MIDAZOLAM HCL 2 MG/2ML IJ SOLN
INTRAMUSCULAR | Status: DC | PRN
  Administered 2016-11-04: 1 mg via INTRAVENOUS

## 2016-11-04 MED ORDER — AMIODARONE HCL 150 MG/3ML IV SOLN
INTRAVENOUS | Status: AC
  Filled 2016-11-04: qty 3

## 2016-11-04 MED ORDER — FENTANYL CITRATE (PF) 100 MCG/2ML IJ SOLN
INTRAMUSCULAR | Status: DC | PRN
  Administered 2016-11-04: 25 ug via INTRAVENOUS

## 2016-11-04 MED ORDER — FENTANYL CITRATE (PF) 100 MCG/2ML IJ SOLN
INTRAMUSCULAR | Status: DC | PRN
  Administered 2016-11-04 (×2): 25 ug via INTRAVENOUS

## 2016-11-04 MED ORDER — BIVALIRUDIN 250 MG IV SOLR
INTRAVENOUS | Status: DC | PRN
  Administered 2016-11-04: 1.75 mg/kg/h via INTRAVENOUS

## 2016-11-04 MED ORDER — TIROFIBAN HCL IV 12.5 MG/250 ML
INTRAVENOUS | Status: DC | PRN
  Administered 2016-11-04: 0.075 ug/kg/min via INTRAVENOUS

## 2016-11-04 MED ORDER — ATORVASTATIN CALCIUM 80 MG PO TABS
80.0000 mg | ORAL_TABLET | Freq: Every day | ORAL | Status: DC
  Administered 2016-11-04 – 2016-11-09 (×6): 80 mg via ORAL
  Filled 2016-11-04 (×6): qty 1

## 2016-11-04 MED ORDER — TIROFIBAN HCL IN NACL 5-0.9 MG/100ML-% IV SOLN
INTRAVENOUS | Status: AC
  Filled 2016-11-04: qty 100

## 2016-11-04 MED ORDER — AMIODARONE LOAD VIA INFUSION
INTRAVENOUS | Status: DC | PRN
  Administered 2016-11-04: 150 mg via INTRAVENOUS

## 2016-11-04 MED ORDER — TICAGRELOR 90 MG PO TABS
ORAL_TABLET | ORAL | Status: DC | PRN
  Administered 2016-11-04: 180 mg via ORAL

## 2016-11-04 MED ORDER — SODIUM CHLORIDE 0.9% FLUSH
3.0000 mL | Freq: Two times a day (BID) | INTRAVENOUS | Status: DC
  Administered 2016-11-05: 3 mL via INTRAVENOUS
  Administered 2016-11-05: 10 mL via INTRAVENOUS
  Administered 2016-11-06 – 2016-11-08 (×4): 3 mL via INTRAVENOUS

## 2016-11-04 MED ORDER — TIROFIBAN HCL IV 12.5 MG/250 ML: INTRAVENOUS | Status: DC | PRN

## 2016-11-04 MED ORDER — NITROGLYCERIN IN D5W 200-5 MCG/ML-% IV SOLN
INTRAVENOUS | Status: DC | PRN
  Administered 2016-11-04: 10 ug/min via INTRAVENOUS

## 2016-11-04 MED ORDER — SODIUM CHLORIDE 0.9 % IV SOLN
INTRAVENOUS | Status: DC | PRN
  Administered 2016-11-04: 10 mL/h via INTRAVENOUS

## 2016-11-04 MED ORDER — HEPARIN SODIUM (PORCINE) 1000 UNIT/ML IJ SOLN
INTRAMUSCULAR | Status: DC | PRN
  Administered 2016-11-04: 3000 [IU] via INTRAVENOUS

## 2016-11-04 MED ORDER — BIVALIRUDIN BOLUS VIA INFUSION - CUPID
INTRAVENOUS | Status: DC | PRN
  Administered 2016-11-04: 63 mg via INTRAVENOUS

## 2016-11-04 MED ORDER — FUROSEMIDE 10 MG/ML IJ SOLN
INTRAMUSCULAR | Status: AC
  Filled 2016-11-04: qty 4

## 2016-11-04 MED ORDER — VERAPAMIL HCL 2.5 MG/ML IV SOLN
INTRAVENOUS | Status: DC | PRN
  Administered 2016-11-04 (×2): 200 ug via INTRACORONARY

## 2016-11-04 SURGICAL SUPPLY — 56 items
BALLN EMERGE MR 2.0X12 (BALLOONS) ×2
BALLN EMERGE MR 2.5X12 (BALLOONS) ×2
BALLN EMERGE MR 2.5X20 (BALLOONS) ×2
BALLN EMERGE MR 2.5X30 (BALLOONS) ×2
BALLN EMERGE MR 2.75X20 (BALLOONS)
BALLN EMERGE MR 3.0X20 (BALLOONS) ×2
BALLN EMERGE MR PUSH 1.5X15 (BALLOONS) ×2
BALLN ~~LOC~~ EMERGE MR 3.0X30 (BALLOONS) ×2
BALLN ~~LOC~~ EMERGE MR 3.0X8 (BALLOONS) ×2
BALLOON EMERGE MR 2.0X12 (BALLOONS) ×1 IMPLANT
BALLOON EMERGE MR 2.5X12 (BALLOONS) ×1 IMPLANT
BALLOON EMERGE MR 2.5X20 (BALLOONS) ×1 IMPLANT
BALLOON EMERGE MR 2.5X30 (BALLOONS) ×1 IMPLANT
BALLOON EMERGE MR 2.75X20 (BALLOONS) IMPLANT
BALLOON EMERGE MR 3.0X20 (BALLOONS) ×1 IMPLANT
BALLOON EMERGE MR PUSH 1.5X15 (BALLOONS) ×1 IMPLANT
BALLOON ~~LOC~~ EMERGE MR 3.0X30 (BALLOONS) ×1 IMPLANT
BALLOON ~~LOC~~ EMERGE MR 3.0X8 (BALLOONS) ×1 IMPLANT
CATH EXTRAC PRONTO 5.5F 138CM (CATHETERS) ×2 IMPLANT
CATH INFINITI 5F JL4 125CM (CATHETERS) ×2 IMPLANT
CATH INFINITI 5F PIG 125CM (CATHETERS) ×2 IMPLANT
CATH INFINITI 5FR AL1 (CATHETERS) ×2 IMPLANT
CATH INFINITI 5FR JR4 125CM (CATHETERS) ×2 IMPLANT
CATH INFINITI 5FR MULTPACK ANG (CATHETERS) ×2 IMPLANT
CATH LAUNCHER 5F EBU4.0 (CATHETERS) ×2 IMPLANT
CATH LAUNCHER 5F NOTO (CATHETERS) ×1 IMPLANT
CATH LAUNCHER 5F RBU3.5 (CATHETERS) ×2 IMPLANT
CATH SWAN GANZ 7F STRAIGHT (CATHETERS) ×2 IMPLANT
CATHETER LAUNCHER 5F NOTO (CATHETERS) ×2
ELECT DEFIB PAD ADLT CADENCE (PAD) ×2 IMPLANT
FEM STOP ARCH (HEMOSTASIS) ×1
GUIDE CATH RUNWAY 6FR VL4 (CATHETERS) ×4 IMPLANT
GUIDEWIRE AMPLATZER 1.5JX260 (WIRE) ×2 IMPLANT
KIT ENCORE 26 ADVANTAGE (KITS) ×2 IMPLANT
KIT HEART LEFT (KITS) ×2 IMPLANT
KIT HEART RIGHT NAMIC (KITS) ×2 IMPLANT
NEEDLE SMART 18GX3.5CM LONG (NEEDLE) ×2 IMPLANT
PACK CARDIAC CATHETERIZATION (CUSTOM PROCEDURE TRAY) ×2 IMPLANT
PINNACLE LONG 5F 25CM (SHEATH) ×2
PINNACLE LONG 6F 25CM (SHEATH) ×2
SHEATH INTRO PINNACLE 6F 25CM (SHEATH) ×1 IMPLANT
SHEATH INTROD PINNACLE 5F 25CM (SHEATH) ×1 IMPLANT
SHEATH PINNACLE 5F 10CM (SHEATH) ×2 IMPLANT
SHEATH PINNACLE 6F 10CM (SHEATH) ×2 IMPLANT
SHEATH PINNACLE 7F 10CM (SHEATH) ×2 IMPLANT
SHEATH PINNACLE ST 6F 45CM (SHEATH) ×2 IMPLANT
STENT XIENCE ALPINE RX 2.5X38 (Permanent Stent) ×2 IMPLANT
STENT XIENCE ALPINE RX 3.0X33 (Permanent Stent) ×2 IMPLANT
STENT XIENCE ALPINE RX 3.5X8 (Permanent Stent) ×2 IMPLANT
SYSTEM COMPRESSION FEMOSTOP (HEMOSTASIS) ×1 IMPLANT
TRANSDUCER W/STOPCOCK (MISCELLANEOUS) ×4 IMPLANT
WIRE EMERALD 3MM-J .025X260CM (WIRE) ×2 IMPLANT
WIRE EMERALD 3MM-J .035X150CM (WIRE) ×4 IMPLANT
WIRE EMERALD 3MM-J .035X260CM (WIRE) ×2 IMPLANT
WIRE PT2 MS 185 (WIRE) ×4 IMPLANT
WIRE RUNTHROUGH .014X180CM (WIRE) ×2 IMPLANT

## 2016-11-04 NOTE — Interval H&P Note (Signed)
History and Physical Interval Note:  11/04/2016 8:23 AM  Norman Baker  has presented today for surgery, with the diagnosis of sob, abnormal stress test  The various methods of treatment have been discussed with the patient and family. After consideration of risks, benefits and other options for treatment, the patient has consented to  Procedure(s): Right/Left Heart Cath and Coronary Angiography (N/A) as a surgical intervention .  The patient's history has been reviewed, patient examined, no change in status, stable for surgery.  I have reviewed the patient's chart and labs.  Questions were answered to the patient's satisfaction.     Leilanie Rauda S

## 2016-11-04 NOTE — Progress Notes (Signed)
CRITICAL VALUE ALERT  Critical value received:  Troponin 61.86  Date of notification: 11/04/16  Time of notification:  1630  Critical value read back:Yes.    Nurse who received alert:  Steph. C  MD notified (1st page):  Algie Coffer  Time of first page:  1640  MD notified (2nd page):  Time of second page:  Responding MD:  Algie Coffer   Time MD responded:  1640

## 2016-11-04 NOTE — Progress Notes (Signed)
Pt had cath procedure today, and is being transferred to [facility], and will not be returning to [facility].  Pt belongings have been packed, given to son.  Son is aware of transfer.

## 2016-11-04 NOTE — H&P (View-Only) (Signed)
Ref: EDWARDS, Norman P, NP   Subjective:  Abnormal nuclear stress test with inferior wall ischemia. Patient understood need for cardiac catheterization using arabic interpreter via audi-video service. VS stable. Positive cough.  Objective:  Vital Signs in the last 24 hours: Temp:  [97.3 F (36.3 C)-98.3 F (36.8 C)] 97.3 F (36.3 C) (04/11 1144) Pulse Rate:  [73-100] 100 (04/11 1144) Cardiac Rhythm: Normal sinus rhythm (04/11 0713) Resp:  [18] 18 (04/11 1144) BP: (117-154)/(66-94) 138/83 (04/11 1144) SpO2:  [93 %-98 %] 98 % (04/11 1144) Weight:  [83.5 kg (184 lb 1.6 oz)] 83.5 kg (184 lb 1.6 oz) (04/11 1610)  Physical Exam: BP Readings from Last 1 Encounters:  11/03/16 138/83    Wt Readings from Last 1 Encounters:  11/03/16 83.5 kg (184 lb 1.6 oz)    Weight change: -0.68 kg (-1 lb 8 oz) Body mass index is 30.64 kg/m. HEENT: [place]/AT, Eyes-Brown, PERL, EOMI, Conjunctiva-Pink, Sclera-Non-icteric Neck: No JVD, No bruit, Trachea midline. Lungs:  Fine crackles, Bilateral. Cardiac:  Regular rhythm, normal S1 and S2, no S3. II/VI systolic murmur. Abdomen:  Soft, non-tender. BS present. Extremities:  No edema present. No cyanosis. No clubbing. CNS: AxOx3, Cranial nerves grossly intact, moves all 4 extremities.  Skin: Warm and dry.   Intake/Output from previous day: 04/10 0701 - 04/11 0700 In: 907.9 [Baker.O.:700; I.V.:207.9] Out: 300 [Urine:300]    Lab Results: BMET    Component Value Date/Time   NA 139 11/02/2016 0819   NA 139 11/01/2016 2100   K 3.9 11/02/2016 0819   K 3.5 11/01/2016 2100   CL 107 11/02/2016 0819   CL 106 11/01/2016 2100   CO2 23 11/02/2016 0819   CO2 24 11/01/2016 2100   GLUCOSE 102 (H) 11/02/2016 0819   GLUCOSE 112 (H) 11/01/2016 2100   BUN 30 (H) 11/02/2016 0819   BUN 34 (H) 11/01/2016 2100   CREATININE 1.14 11/02/2016 0819   CREATININE 1.36 (H) 11/01/2016 2100   CALCIUM 8.6 (L) 11/02/2016 0819   CALCIUM 8.5 (L) 11/01/2016 2100   GFRNONAA >60  11/02/2016 0819   GFRNONAA 49 (L) 11/01/2016 2100   GFRAA >60 11/02/2016 0819   GFRAA 57 (L) 11/01/2016 2100   CBC    Component Value Date/Time   WBC 8.7 11/02/2016 0819   RBC 4.74 11/02/2016 0819   HGB 11.5 (L) 11/02/2016 0819   HCT 35.5 (L) 11/02/2016 0819   PLT 412 (H) 11/02/2016 0819   MCV 74.9 (L) 11/02/2016 0819   MCH 24.3 (L) 11/02/2016 0819   MCHC 32.4 11/02/2016 0819   RDW 14.9 11/02/2016 0819   HEPATIC Function Panel  Recent Labs  11/01/16 2100  PROT 6.3*   HEMOGLOBIN A1C No components found for: HGA1C,  MPG CARDIAC ENZYMES Lab Results  Component Value Date   TROPONINI <0.03 11/02/2016   TROPONINI <0.03 11/02/2016   TROPONINI <0.03 11/01/2016   BNP No results for input(s): PROBNP in the last 8760 hours. TSH No results for input(s): TSH in the last 8760 hours. CHOLESTEROL  Recent Labs  11/02/16 0335  CHOL 158    Scheduled Meds: . amLODipine  10 mg Oral Daily  . aspirin EC  81 mg Oral Daily  . gemfibrozil  600 mg Oral BID AC  . tamsulosin  0.4 mg Oral Daily   Continuous Infusions: . heparin 1,350 Units/hr (11/03/16 1515)   PRN Meds:.acetaminophen, guaiFENesin-dextromethorphan, ipratropium-albuterol, nitroGLYCERIN, ondansetron (ZOFRAN) IV  Assessment/Plan: Chest pain CAD Shortness of breath Chronic cough Interstitial lung disease Hypertension Bilateral knee  arthritis Dyslipidemia  Echocardiogram pending. Right and left heart catheterization in AM. Left message with son's phone to all me back.    LOS: 2 days    Orpah Cobb  MD  11/03/2016, 7:02 PM

## 2016-11-05 ENCOUNTER — Inpatient Hospital Stay (HOSPITAL_COMMUNITY): Payer: Medicaid Other

## 2016-11-05 DIAGNOSIS — R079 Chest pain, unspecified: Secondary | ICD-10-CM

## 2016-11-05 LAB — ECHOCARDIOGRAM COMPLETE
HEIGHTINCHES: 65 in
Weight: 2963.2 oz

## 2016-11-05 LAB — CBC
HEMATOCRIT: 34.4 % — AB (ref 39.0–52.0)
Hemoglobin: 11.2 g/dL — ABNORMAL LOW (ref 13.0–17.0)
MCH: 24.5 pg — ABNORMAL LOW (ref 26.0–34.0)
MCHC: 32.6 g/dL (ref 30.0–36.0)
MCV: 75.1 fL — ABNORMAL LOW (ref 78.0–100.0)
PLATELETS: 383 10*3/uL (ref 150–400)
RBC: 4.58 MIL/uL (ref 4.22–5.81)
RDW: 14.8 % (ref 11.5–15.5)
WBC: 13.6 10*3/uL — AB (ref 4.0–10.5)

## 2016-11-05 LAB — BASIC METABOLIC PANEL
Anion gap: 10 (ref 5–15)
BUN: 17 mg/dL (ref 6–20)
CALCIUM: 8.8 mg/dL — AB (ref 8.9–10.3)
CO2: 24 mmol/L (ref 22–32)
CREATININE: 1.09 mg/dL (ref 0.61–1.24)
Chloride: 102 mmol/L (ref 101–111)
GFR calc Af Amer: >60 mL/min (ref >60)
Glucose, Bld: 138 mg/dL — ABNORMAL HIGH (ref 65–99)
POTASSIUM: 3.3 mmol/L — AB (ref 3.5–5.1)
SODIUM: 136 mmol/L (ref 135–145)

## 2016-11-05 LAB — TROPONIN I
TROPONIN I: 55.26 ng/mL — AB (ref <0.03)
TROPONIN I: 33.95 ng/mL — AB (ref <0.03)
Troponin I: 28.28 ng/mL (ref <0.03)

## 2016-11-05 MED ORDER — IPRATROPIUM-ALBUTEROL 0.5-2.5 (3) MG/3ML IN SOLN
3.0000 mL | Freq: Four times a day (QID) | RESPIRATORY_TRACT | Status: DC
Start: 2016-11-05 — End: 2016-11-06
  Administered 2016-11-06: 3 mL via RESPIRATORY_TRACT
  Filled 2016-11-05: qty 3

## 2016-11-05 MED ORDER — POTASSIUM CHLORIDE CRYS ER 20 MEQ PO TBCR
20.0000 meq | EXTENDED_RELEASE_TABLET | Freq: Three times a day (TID) | ORAL | Status: DC
  Administered 2016-11-05 (×3): 20 meq via ORAL
  Filled 2016-11-05 (×6): qty 1

## 2016-11-05 NOTE — Progress Notes (Signed)
Ref: EDWARDS, MICHELLE P, NP   Subjective:  Cough continues. Feels better. Good LV systolic function with LVH and ascending aortic dilatation to 4 cm.   Objective:  Vital Signs in the last 24 hours: Temp:  [97.6 F (36.4 C)-99.2 F (37.3 C)] 97.7 F (36.5 C) (04/13 1500) Pulse Rate:  [76-110] 101 (04/13 1823) Cardiac Rhythm: Normal sinus rhythm;Bundle branch block (04/13 0703) Resp:  [22-32] 24 (04/13 1823) BP: (97-129)/(51-99) 119/83 (04/13 1823) SpO2:  [91 %-98 %] 96 % (04/13 1823)  Physical Exam: BP Readings from Last 1 Encounters:  11/05/16 119/83    Wt Readings from Last 1 Encounters:  11/04/16 84 kg (185 lb 3.2 oz)    Weight change:  Body mass index is 30.82 kg/m. HEENT: [place]/AT, Eyes-Brown, PERL, EOMI, Conjunctiva-Pink, Sclera-Non-icteric Neck: No JVD, No bruit, Trachea midline. Lungs:  Fine crackles, Bilateral. Cardiac:  Regular rhythm, normal S1 and S2, no S3. II/VI systolic murmur. Abdomen:  Soft, non-tender. BS present. Extremities:  No edema present. No cyanosis. No clubbing. No significant right groin hematoma. CNS: AxOx3, Cranial nerves grossly intact, moves all 4 extremities.  Skin: Warm and dry.   Intake/Output from previous day: 04/12 0701 - 04/13 0700 In: 1013.1 [I.V.:1013.1] Out: 3650 [Urine:3650]    Lab Results: BMET    Component Value Date/Time   NA 136 11/05/2016 0254   NA 138 11/04/2016 0245   NA 139 11/02/2016 0819   K 3.3 (L) 11/05/2016 0254   K 3.7 11/04/2016 0245   K 3.9 11/02/2016 0819   CL 102 11/05/2016 0254   CL 107 11/04/2016 0245   CL 107 11/02/2016 0819   CO2 24 11/05/2016 0254   CO2 23 11/04/2016 0245   CO2 23 11/02/2016 0819   GLUCOSE 138 (H) 11/05/2016 0254   GLUCOSE 129 (H) 11/04/2016 0245   GLUCOSE 102 (H) 11/02/2016 0819   BUN 17 11/05/2016 0254   BUN 23 (H) 11/04/2016 0245   BUN 30 (H) 11/02/2016 0819   CREATININE 1.09 11/05/2016 0254   CREATININE 1.05 11/04/2016 0245   CREATININE 1.14 11/02/2016 0819   CALCIUM  8.8 (L) 11/05/2016 0254   CALCIUM 8.5 (L) 11/04/2016 0245   CALCIUM 8.6 (L) 11/02/2016 0819   GFRNONAA >60 11/05/2016 0254   GFRNONAA >60 11/04/2016 0245   GFRNONAA >60 11/02/2016 0819   GFRAA >60 11/05/2016 0254   GFRAA >60 11/04/2016 0245   GFRAA >60 11/02/2016 0819   CBC    Component Value Date/Time   WBC 13.6 (H) 11/05/2016 0254   RBC 4.58 11/05/2016 0254   HGB 11.2 (L) 11/05/2016 0254   HCT 34.4 (L) 11/05/2016 0254   PLT 383 11/05/2016 0254   MCV 75.1 (L) 11/05/2016 0254   MCH 24.5 (L) 11/05/2016 0254   MCHC 32.6 11/05/2016 0254   RDW 14.8 11/05/2016 0254   HEPATIC Function Panel  Recent Labs  11/01/16 2100  PROT 6.3*   HEMOGLOBIN A1C No components found for: HGA1C,  MPG CARDIAC ENZYMES Lab Results  Component Value Date   TROPONINI 28.28 (HH) 11/05/2016   TROPONINI 33.95 (HH) 11/05/2016   TROPONINI 55.26 (HH) 11/05/2016   BNP No results for input(s): PROBNP in the last 8760 hours. TSH No results for input(s): TSH in the last 8760 hours. CHOLESTEROL  Recent Labs  11/02/16 0335  CHOL 158    Scheduled Meds: . aspirin EC  81 mg Oral Daily  . atorvastatin  80 mg Oral q1800  . gemfibrozil  600 mg Oral BID AC  .  ipratropium-albuterol  3 mL Inhalation QID  . losartan  25 mg Oral Daily  . metoprolol tartrate  12.5 mg Oral BID  . pantoprazole  40 mg Oral Q0600  . potassium chloride SA  20 mEq Oral TID  . sodium chloride flush  3 mL Intravenous Q12H  . sodium chloride flush  3 mL Intravenous Q12H  . tamsulosin  0.4 mg Oral Daily  . 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 See pulmonary on OP basis.     LOS: 4 days    Orpah Cobb  MD  11/05/2016, 6:40 PM

## 2016-11-05 NOTE — Care Management Note (Signed)
Case Management Note  Patient Details  Name: Norman Baker MRN: [id number] Date of Birth: September 29, 1940  Subjective/Objective:    Pt admitted with SOB                 Action/Plan: PTA independent from home with family.  Son at bedside and provided information.   Pt is active with the [facility] and has orange card - pt gets meds from [facility] at little to no cost.  CM will continue to follow for discharge needs    Expected Discharge Date:                  Expected Discharge Plan:  Home/Self Care  In-House Referral:     Discharge planning Services  CM Consult  Post Acute Care Choice:    Choice offered to:     DME Arranged:    DME Agency:     HH Arranged:    HH Agency:     Status of Service:  In process, will continue to follow  If discussed at Long Length of Stay Meetings, dates discussed:    Additional Comments:  Cherylann Parr, RN 11/05/2016, 11:30 AM

## 2016-11-05 NOTE — Progress Notes (Addendum)
CARDIAC REHAB PHASE I   PRE:  Rate/Rhythm: 84 SR    BP: sitting 119/80    SaO2:   MODE:  Ambulation: to recliner   POST:  Rate/Rhythm: 90 SR    BP: sitting 109/75     SaO2: 89 RA, up to 92 RA  Son not here. I used [place] interpreter however no video available, just audio. Pt c/o SOB and CP. His CP is worse with coughing seemingly. He seems restless and tampering with the cords. Pt got up and walked to the recliner before the recliner was ready and cords were out of the way. He held to the bed. SOB and SaO2 to 89 RA. Up with rest. Communication is very difficult, even with phone interpreter, as pt seems frustrated. He would like to wait on walking and education for tomorrow when his son is here. We will f/u then. Left pt on [facility] with RN present.  [phone]   Harriet Masson CES, ACSM 11/05/2016 1:03 PM

## 2016-11-05 NOTE — Progress Notes (Signed)
  Echocardiogram 2D Echocardiogram has been performed.  Norman Baker 11/05/2016, 11:24 AM

## 2016-11-06 ENCOUNTER — Inpatient Hospital Stay (HOSPITAL_COMMUNITY): Payer: Medicaid Other

## 2016-11-06 LAB — BASIC METABOLIC PANEL
ANION GAP: 12 (ref 5–15)
BUN: 18 mg/dL (ref 6–20)
CHLORIDE: 103 mmol/L (ref 101–111)
CO2: 22 mmol/L (ref 22–32)
Calcium: 9.1 mg/dL (ref 8.9–10.3)
Creatinine, Ser: 1.09 mg/dL (ref 0.61–1.24)
GFR calc Af Amer: >60 mL/min (ref >60)
GFR calc non Af Amer: >60 mL/min (ref >60)
Glucose, Bld: 114 mg/dL — ABNORMAL HIGH (ref 65–99)
POTASSIUM: 4.2 mmol/L (ref 3.5–5.1)
SODIUM: 137 mmol/L (ref 135–145)

## 2016-11-06 MED ORDER — DEXTROSE 5 % IV SOLN
500.0000 mg | INTRAVENOUS | Status: DC
  Administered 2016-11-06 – 2016-11-07 (×2): 500 mg via INTRAVENOUS
  Filled 2016-11-06 (×2): qty 500

## 2016-11-06 MED ORDER — IPRATROPIUM-ALBUTEROL 0.5-2.5 (3) MG/3ML IN SOLN: 3.0000 mL | RESPIRATORY_TRACT | Status: DC | PRN

## 2016-11-06 NOTE — Progress Notes (Signed)
Subjective:  Patient denies any chest pain or shortness of breath complaints of cough with whitish yellow mucus denies any fever. States overall feels better. His son-in-law at the bedside. Cardiac enzymes are trending down had occasional episode of hypotension.  Objective:  Vital Signs in the last 24 hours: Temp:  [97.6 F (36.4 C)-98.3 F (36.8 C)] 97.6 F (36.4 C) (04/14 0823) Pulse Rate:  [76-113] 113 (04/14 0823) Resp:  [17-32] 23 (04/14 0823) BP: (87-123)/(51-84) 112/81 (04/14 0823) SpO2:  [90 %-96 %] 91 % (04/14 0831)  Intake/Output from previous day: 04/13 0701 - 04/14 0700 In: 360 [P.O.:360] Out: 300 [Urine:300] Intake/Output from this shift: No intake/output data recorded.  Physical Exam: Neck: no adenopathy, no carotid bruit and supple, symmetrical, trachea midline Lungs: Decreased breath sound at bases Heart: Tachycardic S1-S2 soft there is 2/6 systolic murmur Abdomen: soft, non-tender; bowel sounds normal; no masses,  no organomegaly Extremities: extremities normal, atraumatic, no cyanosis or edema and Right groin mild ecchymosis noted no significant hematoma  Lab Results:  Recent Labs  11/04/16 0245 11/05/16 0254  WBC 11.4* 13.6*  HGB 10.8* 11.2*  PLT 375 383    Recent Labs  11/05/16 0254 11/06/16 0635  NA 136 137  K 3.3* 4.2  CL 102 103  CO2 24 22  GLUCOSE 138* 114*  BUN 17 18  CREATININE 1.09 1.09    Recent Labs  11/05/16 0816 11/05/16 1503  TROPONINI 33.95* 28.28*   Hepatic Function Panel No results for input(s): PROT, ALBUMIN, AST, ALT, ALKPHOS, BILITOT, BILIDIR, IBILI in the last 72 hours. No results for input(s): CHOL in the last 72 hours. No results for input(s): PROTIME in the last 72 hours.  Imaging: Imaging results have been reviewed and Ct Chest High Resolution  Result Date: 11/06/2016 CLINICAL DATA:  76 year old male with clinical suspicion for interstitial lung disease. EXAM: CT CHEST WITHOUT CONTRAST TECHNIQUE:  Multidetector CT imaging of the chest was performed following the standard protocol without intravenous contrast. High resolution imaging of the lungs, as well as inspiratory and expiratory imaging, was performed. COMPARISON:  Chest CT 11/02/2016. FINDINGS: Cardiovascular: Heart size is mildly enlarged. There is no significant pericardial fluid, thickening or pericardial calcification. There is aortic atherosclerosis, as well as atherosclerosis of the great vessels of the mediastinum and the coronary arteries, including calcified atherosclerotic plaque in the left main, left anterior descending, left circumflex and right coronary arteries. Mediastinum/Nodes: There are multiple borderline enlarged and mildly enlarged mediastinal and bilateral hilar lymph nodes, most of which demonstrate faint calcifications, largest of which measures up to 1.2 cm in short axis in the low right paratracheal nodal station. Esophagus is unremarkable in appearance. No axillary lymphadenopathy. Lungs/Pleura: High-resolution images demonstrate widespread areas of subpleural reticulation, parenchymal banding, traction bronchiectasis and some honeycombing. These findings have a definitive craniocaudal gradient. A few patchy areas of ground-glass attenuation are also noted, most evident throughout the lower lobes of the lungs bilaterally. In addition, there is diffuse bronchial wall thickening with mild to moderate centrilobular emphysema. No acute confluent consolidative airspace disease. No pleural effusions. Inspiratory and expiratory imaging is unremarkable. No definite suspicious appearing pulmonary nodules or masses are noted. Upper Abdomen: Aortic atherosclerosis. Multiple small calcified granulomas in the liver. Exophytic low-attenuation lesion extending off the upper pole of the left kidney posteriorly measuring up to 2.8 cm in diameter and other smaller low-attenuation lesions in the right kidney are incompletely characterized on  today's noncontrast CT examination, but favored to represent cysts. Musculoskeletal: There are no  aggressive appearing lytic or blastic lesions noted in the visualized portions of the skeleton. IMPRESSION: 1. 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 Repeat high-resolution chest CT is recommended in 12 months to assess for temporal changes in the appearance of the lung parenchyma. 2. In addition, there is diffuse bronchial wall thickening with mild to moderate centrilobular emphysema; imaging features suggesting concurrent COPD. 3. Aortic atherosclerosis, in addition to left main and 3 vessel coronary artery disease. Assessment for potential risk factor modification, dietary therapy or pharmacologic therapy may be warranted, if clinically indicated. 4. Mild cardiomegaly. 5. Additional incidental findings, as above. Electronically Signed   By: Trudie Reed M.D.   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  LOS: 5 days    Rinaldo Cloud 11/06/2016, 9:18 AM

## 2016-11-06 NOTE — Progress Notes (Signed)
Document signed by Brother-in-law and son to interpret for patient when they are present.  Form in shadow chart.

## 2016-11-06 NOTE — Progress Notes (Signed)
RN receiving patient made aware that pt has not voided since 0600AM

## 2016-11-07 LAB — BASIC METABOLIC PANEL
ANION GAP: 8 (ref 5–15)
BUN: 24 mg/dL — ABNORMAL HIGH (ref 6–20)
CALCIUM: 8.6 mg/dL — AB (ref 8.9–10.3)
CHLORIDE: 104 mmol/L (ref 101–111)
CO2: 23 mmol/L (ref 22–32)
Creatinine, Ser: 1.23 mg/dL (ref 0.61–1.24)
GFR calc Af Amer: >60 mL/min (ref >60)
GFR calc non Af Amer: 55 mL/min — ABNORMAL LOW (ref >60)
GLUCOSE: 135 mg/dL — AB (ref 65–99)
POTASSIUM: 3.7 mmol/L (ref 3.5–5.1)
Sodium: 135 mmol/L (ref 135–145)

## 2016-11-07 LAB — CBC
HEMATOCRIT: 30.5 % — AB (ref 39.0–52.0)
Hemoglobin: 9.9 g/dL — ABNORMAL LOW (ref 13.0–17.0)
MCH: 24.4 pg — ABNORMAL LOW (ref 26.0–34.0)
MCHC: 32.5 g/dL (ref 30.0–36.0)
MCV: 75.1 fL — AB (ref 78.0–100.0)
Platelets: 323 10*3/uL (ref 150–400)
RBC: 4.06 MIL/uL — ABNORMAL LOW (ref 4.22–5.81)
RDW: 14.7 % (ref 11.5–15.5)
WBC: 12.4 10*3/uL — AB (ref 4.0–10.5)

## 2016-11-07 LAB — TROPONIN I: Troponin I: 13.39 ng/mL (ref <0.03)

## 2016-11-07 MED ORDER — AZITHROMYCIN 500 MG PO TABS
500.0000 mg | ORAL_TABLET | Freq: Every day | ORAL | Status: DC
  Administered 2016-11-08 – 2016-11-09 (×2): 500 mg via ORAL
  Filled 2016-11-07 (×2): qty 1

## 2016-11-07 NOTE — Progress Notes (Signed)
Status post nonsustained Subjective:  Denies any anginal chest pain states coughing has improved. Denies any palpitation noted to have few beats of nonsustained VT and SVT asymptomatic. Troponin are trending down has significant drop in hemoglobin this a.m. denies any bright red blood or melena. States noticed streaks of blood with coughing. Denies any fever or chills.  Objective:  Vital Signs in the last 24 hours: Temp:  [98.1 F (36.7 C)-98.8 F (37.1 C)] 98.3 F (36.8 C) (04/15 0459) Pulse Rate:  [79-97] 92 (04/15 0459) Resp:  [15-24] 20 (04/15 0459) BP: (105-119)/(58-81) 105/58 (04/15 0459) SpO2:  [92 %-100 %] 97 % (04/15 0459)  Intake/Output from previous day: 04/14 0701 - 04/15 0700 In: 1090 [P.O.:840; IV Piggyback:250] Out: 200 [Urine:200] Intake/Output from this shift: No intake/output data recorded.  Physical Exam: Neck: no adenopathy, no carotid bruit, no JVD and supple, symmetrical, trachea midline Lungs: Decreased breath sound at bases Heart: Decreased breath sound at bases Abdomen: soft, non-tender; bowel sounds normal; no masses,  no organomegaly Extremities: extremities normal, atraumatic, no cyanosis or edema and Right groin stable  Lab Results:  Recent Labs  11/05/16 0254 11/07/16 0220  WBC 13.6* 12.4*  HGB 11.2* 9.9*  PLT 383 323    Recent Labs  11/06/16 0635 11/07/16 0220  NA 137 135  K 4.2 3.7  CL 103 104  CO2 22 23  GLUCOSE 114* 135*  BUN 18 24*  CREATININE 1.09 1.23    Recent Labs  11/05/16 1503 11/07/16 0220  TROPONINI 28.28* 13.39*   Hepatic Function Panel No results for input(s): PROT, ALBUMIN, AST, ALT, ALKPHOS, BILITOT, BILIDIR, IBILI in the last 72 hours. No results for input(s): CHOL in the last 72 hours. No results for input(s): PROTIME in the last 72 hours.  Imaging: Imaging results have been reviewed and Ct Chest High Resolution  Result Date: 11/06/2016 CLINICAL DATA:  76 year old male with clinical suspicion for  interstitial lung disease. EXAM: CT CHEST WITHOUT CONTRAST TECHNIQUE: Multidetector CT imaging of the chest was performed following the standard protocol without intravenous contrast. High resolution imaging of the lungs, as well as inspiratory and expiratory imaging, was performed. COMPARISON:  Chest CT 11/02/2016. FINDINGS: Cardiovascular: Heart size is mildly enlarged. There is no significant pericardial fluid, thickening or pericardial calcification. There is aortic atherosclerosis, as well as atherosclerosis of the great vessels of the mediastinum and the coronary arteries, including calcified atherosclerotic plaque in the left main, left anterior descending, left circumflex and right coronary arteries. Mediastinum/Nodes: There are multiple borderline enlarged and mildly enlarged mediastinal and bilateral hilar lymph nodes, most of which demonstrate faint calcifications, largest of which measures up to 1.2 cm in short axis in the low right paratracheal nodal station. Esophagus is unremarkable in appearance. No axillary lymphadenopathy. Lungs/Pleura: High-resolution images demonstrate widespread areas of subpleural reticulation, parenchymal banding, traction bronchiectasis and some honeycombing. These findings have a definitive craniocaudal gradient. A few patchy areas of ground-glass attenuation are also noted, most evident throughout the lower lobes of the lungs bilaterally. In addition, there is diffuse bronchial wall thickening with mild to moderate centrilobular emphysema. No acute confluent consolidative airspace disease. No pleural effusions. Inspiratory and expiratory imaging is unremarkable. No definite suspicious appearing pulmonary nodules or masses are noted. Upper Abdomen: Aortic atherosclerosis. Multiple small calcified granulomas in the liver. Exophytic low-attenuation lesion extending off the upper pole of the left kidney posteriorly measuring up to 2.8 cm in diameter and other smaller  low-attenuation lesions in the right kidney are incompletely characterized  on today's noncontrast CT examination, but favored to represent cysts. Musculoskeletal: There are no aggressive appearing lytic or blastic lesions noted in the visualized portions of the skeleton. IMPRESSION: 1. 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 Repeat high-resolution chest CT is recommended in 12 months to assess for temporal changes in the appearance of the lung parenchyma. 2. In addition, there is diffuse bronchial wall thickening with mild to moderate centrilobular emphysema; imaging features suggesting concurrent COPD. 3. Aortic atherosclerosis, in addition to left main and 3 vessel coronary artery disease. Assessment for potential risk factor modification, dietary therapy or pharmacologic therapy may be warranted, if clinically indicated. 4. Mild cardiomegaly. 5. Additional incidental findings, as above. Electronically Signed   By: Trudie Reed M.D.   On: 11/06/2016 08:03    Cardiac Studies:  Assessment/Plan:  Acute anterior wall MI S/P LAD and diagonal stent Status post nonsustained VT and SVT Chronic cough/bronchitis Chronic interstitial lung disease Hypertension Bilateral knee arthritis Dyslipidemia Ascending aortic dilatation, mild Acute on chronic anemia probably secondary to hydration and blood loss during the procedure rule out GI loss Plan Check stool for occult blood Check CBC, basic metabolic panel, magnesium in a.m.  LOS: 6 days    Rinaldo Cloud 11/07/2016, 9:36 AM

## 2016-11-07 NOTE — Progress Notes (Signed)
PHARMACIST - PHYSICIAN COMMUNICATION  CONCERNING: Antibiotic IV to Oral Route Change Policy  RECOMMENDATION: This patient is receiving azithromycin by the intravenous route.  Based on criteria approved by the Pharmacy and Therapeutics Committee, the antibiotic(s) is/are being converted to the equivalent oral dose form(s).   DESCRIPTION: These criteria include:  Patient being treated for a respiratory tract infection, urinary tract infection, cellulitis or clostridium difficile associated diarrhea if on metronidazole  The patient is not neutropenic and does not exhibit a GI malabsorption state  The patient is eating (either orally or via tube) and/or has been taking other orally administered medications for a least 24 hours  The patient is improving clinically and has a Tmax < 100.5  If you have questions about this conversion, please contact the Pharmacy Department    [phone] )  [facility]   [phone] )  [facility]   [phone] )  [facility]   [phone] )  [facility]   [phone] )  [facility]   Harland German, [place] D 11/07/2016 12:29 PM

## 2016-11-08 LAB — CBC
HEMATOCRIT: 31.8 % — AB (ref 39.0–52.0)
Hemoglobin: 10.4 g/dL — ABNORMAL LOW (ref 13.0–17.0)
MCH: 24.5 pg — AB (ref 26.0–34.0)
MCHC: 32.7 g/dL (ref 30.0–36.0)
MCV: 74.8 fL — AB (ref 78.0–100.0)
Platelets: 360 10*3/uL (ref 150–400)
RBC: 4.25 MIL/uL (ref 4.22–5.81)
RDW: 15.4 % (ref 11.5–15.5)
WBC: 10.9 10*3/uL — ABNORMAL HIGH (ref 4.0–10.5)

## 2016-11-08 LAB — BASIC METABOLIC PANEL
Anion gap: 8 (ref 5–15)
BUN: 26 mg/dL — AB (ref 6–20)
CHLORIDE: 105 mmol/L (ref 101–111)
CO2: 20 mmol/L — AB (ref 22–32)
CREATININE: 1.12 mg/dL (ref 0.61–1.24)
Calcium: 8.4 mg/dL — ABNORMAL LOW (ref 8.9–10.3)
GFR calc Af Amer: >60 mL/min (ref >60)
GFR calc non Af Amer: >60 mL/min (ref >60)
Glucose, Bld: 121 mg/dL — ABNORMAL HIGH (ref 65–99)
POTASSIUM: 3.8 mmol/L (ref 3.5–5.1)
Sodium: 133 mmol/L — ABNORMAL LOW (ref 135–145)

## 2016-11-08 LAB — POCT ACTIVATED CLOTTING TIME
ACTIVATED CLOTTING TIME: 169 s
Activated Clotting Time: 180 seconds

## 2016-11-08 LAB — GLUCOSE, CAPILLARY: GLUCOSE-CAPILLARY: 130 mg/dL — AB (ref 65–99)

## 2016-11-08 LAB — MAGNESIUM: Magnesium: 2 mg/dL (ref 1.7–2.4)

## 2016-11-08 MED ORDER — FUROSEMIDE 10 MG/ML IJ SOLN
40.0000 mg | Freq: Once | INTRAMUSCULAR | Status: AC
  Administered 2016-11-08: 40 mg via INTRAVENOUS
  Filled 2016-11-08: qty 4

## 2016-11-08 MED ORDER — POTASSIUM CHLORIDE ER 10 MEQ PO TBCR
20.0000 meq | EXTENDED_RELEASE_TABLET | Freq: Once | ORAL | Status: AC
  Administered 2016-11-08: 20 meq via ORAL
  Filled 2016-11-08 (×2): qty 2

## 2016-11-08 NOTE — Progress Notes (Signed)
CARDIAC REHAB PHASE I   PRE:  Rate/Rhythm: 75 SR    BP: sitting 93/71    SaO2: 96 RA  MODE:  Ambulation: 270 ft   POST:  Rate/Rhythm: 107 ST with PVC    BP: sitting 144/81     SaO2: 88 RA, up to 93 RA after 2 min rest  Pt used his cane, assist x2 for safety and gait belt. Easily exerted with distance, diaphoretic early, with distance increasingly SOB/wheezing. Had pt rest x3 and practice breathing. SaO2 never below 88 RA. Pt became more unsteady with fatigue toward end. Discussed with pt and son how pt should be aware of how he feels and rest or stop. Reinforced Brilinta and CRPII. CM to see regarding Brilinta. Will refer pt to [facility] CRPII however pts conditioning needs improvement first. [phone]   Harriet Masson CES, ACSM 11/08/2016 10:37 AM

## 2016-11-08 NOTE — Progress Notes (Signed)
CARDIAC REHAB PHASE I   Late Entry for Saturday 11/06/16 @ 1500  1405-1445 In to ambulate and to do discharge education with patient. Primary RN states patient my go home tomorrow. Patient is non-english speaking but son has signed consent to translate. Per MD note cardiac rehab has been placed on hold until patients pulmonary status had improved. Patient remains in bed, breathing comfortable according to his son. All discharge education completed with son including phase 2 cardiac rehab however son has been informed that it has been placed on hold until patient is seen by a pulmonologist. Reviewed MI book, diet/nutrition, and anti-platelet/nitro with son. Gave activity progression but son verbalized understanding that patient would need clearance from cardiologist to begin his home exercise.  Delcenia Inman English PayneRN, BSN 11/08/2016 6:43 AM

## 2016-11-08 NOTE — Progress Notes (Signed)
Ref: EDWARDS, MICHELLE P, NP   Subjective:  Feeling better with antibiotic.   Objective:  Vital Signs in the last 24 hours: Temp:  [98 F (36.7 C)-98.4 F (36.9 C)] 98.2 F (36.8 C) (04/16 1900) Pulse Rate:  [78-90] 84 (04/16 1900) Cardiac Rhythm: Normal sinus rhythm (04/16 1900) Resp:  [16-20] 18 (04/16 1900) BP: (89-124)/(47-77) 113/54 (04/16 1900) SpO2:  [94 %-97 %] 94 % (04/16 1900)  Physical Exam: BP Readings from Last 1 Encounters:  11/08/16 (!) 113/54    Wt Readings from Last 1 Encounters:  11/04/16 84 kg (185 lb 3.2 oz)    Weight change:  Body mass index is 30.82 kg/m. HEENT: [place]/AT, Eyes-Brown, PERL, EOMI, Conjunctiva-Pink, Sclera-Non-icteric Neck: No JVD, No bruit, Trachea midline. Lungs:  Clear, Bilateral. Cardiac:  Regular rhythm, normal S1 and S2, no S3. II/VI systolic murmur. Abdomen:  Soft, non-tender. BS present. Extremities:  No edema present. No cyanosis. No clubbing. CNS: AxOx3, Cranial nerves grossly intact, moves all 4 extremities.  Skin: Warm and dry.   Intake/Output from previous day: 04/15 0701 - 04/16 0700 In: 1040 [P.O.:1040] Out: 850 [Urine:850]    Lab Results: BMET    Component Value Date/Time   NA 133 (L) 11/08/2016 0313   NA 135 11/07/2016 0220   NA 137 11/06/2016 0635   K 3.8 11/08/2016 0313   K 3.7 11/07/2016 0220   K 4.2 11/06/2016 0635   CL 105 11/08/2016 0313   CL 104 11/07/2016 0220   CL 103 11/06/2016 0635   CO2 20 (L) 11/08/2016 0313   CO2 23 11/07/2016 0220   CO2 22 11/06/2016 0635   GLUCOSE 121 (H) 11/08/2016 0313   GLUCOSE 135 (H) 11/07/2016 0220   GLUCOSE 114 (H) 11/06/2016 0635   BUN 26 (H) 11/08/2016 0313   BUN 24 (H) 11/07/2016 0220   BUN 18 11/06/2016 0635   CREATININE 1.12 11/08/2016 0313   CREATININE 1.23 11/07/2016 0220   CREATININE 1.09 11/06/2016 0635   CALCIUM 8.4 (L) 11/08/2016 0313   CALCIUM 8.6 (L) 11/07/2016 0220   CALCIUM 9.1 11/06/2016 0635   GFRNONAA >60 11/08/2016 0313   GFRNONAA 55 (L)  11/07/2016 0220   GFRNONAA >60 11/06/2016 0635   GFRAA >60 11/08/2016 0313   GFRAA >60 11/07/2016 0220   GFRAA >60 11/06/2016 0635   CBC    Component Value Date/Time   WBC 10.9 (H) 11/08/2016 0313   RBC 4.25 11/08/2016 0313   HGB 10.4 (L) 11/08/2016 0313   HCT 31.8 (L) 11/08/2016 0313   PLT 360 11/08/2016 0313   MCV 74.8 (L) 11/08/2016 0313   MCH 24.5 (L) 11/08/2016 0313   MCHC 32.7 11/08/2016 0313   RDW 15.4 11/08/2016 0313   HEPATIC Function Panel  Recent Labs  11/01/16 2100  PROT 6.3*   HEMOGLOBIN A1C No components found for: HGA1C,  MPG CARDIAC ENZYMES Lab Results  Component Value Date   TROPONINI 13.39 (HH) 11/07/2016   TROPONINI 28.28 (HH) 11/05/2016   TROPONINI 33.95 (HH) 11/05/2016   BNP No results for input(s): PROBNP in the last 8760 hours. TSH No results for input(s): TSH in the last 8760 hours. CHOLESTEROL  Recent Labs  11/02/16 0335  CHOL 158    Scheduled Meds: . aspirin EC  81 mg Oral Daily  . atorvastatin  80 mg Oral q1800  . azithromycin  500 mg Oral Daily  . gemfibrozil  600 mg Oral BID AC  . losartan  25 mg Oral Daily  . metoprolol tartrate  12.5 mg Oral BID  . pantoprazole  40 mg Oral Q0600  . sodium chloride flush  3 mL Intravenous Q12H  . sodium chloride flush  3 mL Intravenous Q12H  . ticagrelor  90 mg Oral BID   Continuous Infusions: PRN Meds:.sodium chloride, sodium chloride, acetaminophen, guaiFENesin-dextromethorphan, ipratropium-albuterol, nitroGLYCERIN, ondansetron (ZOFRAN) IV, sodium chloride flush, sodium chloride flush  Assessment/Plan: Acute anterior wall MI S/P LAD and Diagonal stent S/P non-sustained VT and SVT Chronic cough Chronic interstitial lung disease COPD Hypertension Bilateral knee arthritis Dyslipidemia Mild ascending aortic dilatation Acute blood loss anemia Hyponatremia  IV lasix + Potassium. Increase activity. PT/OT consult. Continue antibiotic.    LOS: 7 days    Orpah Cobb  MD   11/08/2016, 9:24 PM

## 2016-11-08 NOTE — Progress Notes (Signed)
Telemetry advised patient experienced a 2.27 second pause, patient was and remains asleep during and after the pause. Will continue to monitor.

## 2016-11-08 NOTE — Care Management Note (Signed)
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 Also provided application for assistance to f/u with PCP- as soon as possible to apply for further assistance for Brilinta. Son to assist pt with this application and voiced understanding of importance. Plan to return home with son and family.   Expected Discharge Date:  11/08/16             Expected Discharge Plan:  Home/Self Care  In-House Referral:     Discharge planning Services  CM Consult, Medication Assistance  Post Acute Care Choice:  NA Choice offered to:  NA  DME Arranged:  N/A DME Agency:  NA  HH Arranged:  NA HH Agency:  NA  Status of Service:  Completed, signed off  If discussed at Long Length of Stay Meetings, dates discussed:    Discharge Disposition: home /self care   Additional Comments:  Darrold Span, RN 11/08/2016, 11:02 AM

## 2016-11-09 DIAGNOSIS — J84112 Idiopathic pulmonary fibrosis: Secondary | ICD-10-CM

## 2016-11-09 LAB — BASIC METABOLIC PANEL
Anion gap: 14 (ref 5–15)
BUN: 31 mg/dL — AB (ref 6–20)
CO2: 19 mmol/L — ABNORMAL LOW (ref 22–32)
Calcium: 9 mg/dL (ref 8.9–10.3)
Chloride: 102 mmol/L (ref 101–111)
Creatinine, Ser: 1.51 mg/dL — ABNORMAL HIGH (ref 0.61–1.24)
GFR calc Af Amer: 50 mL/min — ABNORMAL LOW (ref >60)
GFR, EST NON AFRICAN AMERICAN: 43 mL/min — AB (ref >60)
Glucose, Bld: 163 mg/dL — ABNORMAL HIGH (ref 65–99)
POTASSIUM: 3.7 mmol/L (ref 3.5–5.1)
SODIUM: 135 mmol/L (ref 135–145)

## 2016-11-09 LAB — CBC
HEMATOCRIT: 34.7 % — AB (ref 39.0–52.0)
HEMOGLOBIN: 11.1 g/dL — AB (ref 13.0–17.0)
MCH: 24.1 pg — ABNORMAL LOW (ref 26.0–34.0)
MCHC: 32 g/dL (ref 30.0–36.0)
MCV: 75.3 fL — ABNORMAL LOW (ref 78.0–100.0)
Platelets: 443 10*3/uL — ABNORMAL HIGH (ref 150–400)
RBC: 4.61 MIL/uL (ref 4.22–5.81)
RDW: 14.9 % (ref 11.5–15.5)
WBC: 12.8 10*3/uL — AB (ref 4.0–10.5)

## 2016-11-09 LAB — SEDIMENTATION RATE: Sed Rate: 55 mm/hr — ABNORMAL HIGH (ref 0–16)

## 2016-11-09 MED ORDER — NITROGLYCERIN 0.4 MG SL SUBL: 0.4000 mg | SUBLINGUAL_TABLET | SUBLINGUAL | 1 refills | Status: AC | PRN

## 2016-11-09 MED ORDER — PANTOPRAZOLE SODIUM 40 MG PO TBEC: 40.0000 mg | DELAYED_RELEASE_TABLET | Freq: Every day | ORAL | 3 refills | Status: AC

## 2016-11-09 MED ORDER — ASPIRIN 81 MG PO TBEC: 81.0000 mg | DELAYED_RELEASE_TABLET | Freq: Every day | ORAL | Status: AC

## 2016-11-09 MED ORDER — METOPROLOL TARTRATE 25 MG PO TABS: 25.0000 mg | ORAL_TABLET | Freq: Two times a day (BID) | ORAL | 3 refills | Status: AC

## 2016-11-09 MED ORDER — TICAGRELOR 90 MG PO TABS: 90.0000 mg | ORAL_TABLET | Freq: Two times a day (BID) | ORAL | 6 refills | Status: AC

## 2016-11-09 MED ORDER — AZITHROMYCIN 500 MG PO TABS: 500.0000 mg | ORAL_TABLET | Freq: Every day | ORAL | 0 refills | Status: DC

## 2016-11-09 MED ORDER — LOSARTAN POTASSIUM 25 MG PO TABS
25.0000 mg | ORAL_TABLET | Freq: Every day | ORAL | 3 refills | Status: AC
Start: 2016-11-10 — End: ?

## 2016-11-09 MED ORDER — TAMSULOSIN HCL 0.4 MG PO CAPS: 0.4000 mg | ORAL_CAPSULE | Freq: Every day | ORAL | 3 refills | Status: AC

## 2016-11-09 MED ORDER — ATORVASTATIN CALCIUM 80 MG PO TABS: 80.0000 mg | ORAL_TABLET | Freq: Every day | ORAL | 3 refills | Status: AC

## 2016-11-09 MED ORDER — GUAIFENESIN-DM 100-10 MG/5ML PO SYRP: 5.0000 mL | ORAL_SOLUTION | ORAL | 0 refills | Status: AC | PRN

## 2016-11-09 MED ORDER — IPRATROPIUM-ALBUTEROL 20-100 MCG/ACT IN AERS: 1.0000 | INHALATION_SPRAY | Freq: Four times a day (QID) | RESPIRATORY_TRACT | 3 refills | Status: AC

## 2016-11-09 NOTE — Discharge Summary (Signed)
Physician Discharge Summary  Patient ID: Norman Baker MRN: [id number] DOB/AGE: 76-Mar-1942 76 y.o.  Admit date: 11/01/2016 Discharge date: 11/09/2016  Admission Diagnoses: Chest pain Exertional shortness of breath Chronic cough Hypertension Bilateral knee arthritis Dyslipidemia  Discharge Diagnoses:  Principal Problem: * Acute anterior wall MI * Active Problems:   LAD and Diagonal-2 stents placement   S/P non-sustained VT and SVT   Chronic interstitial lung disease    COPD with hypoxemia   Bronchiectasis   Chronic cough   Hypertension   Bilateral knee arthritis   Dyslipidemia   Mild ascending aortic dilatation   Acute blood loss and possible iron deficiency anemia   Hyperglycemia   Discharged Condition: fair  Hospital Course: 76 years old male from [place] has 2 months of recurrent chest pain and shortness of breath on exertion with cough. He underwent cardiac catheterization for abnormal nuclear stress test. He suffered acute anterior wall MI and emergent stents were placed in Mid, distal LAD and osteal Diagonal by Dr. Sharyn Lull. Post procedure patient had blood loss anemia. Additional pulmonary work-up showed interstitial lung disease with possible infection, COPD and bronchiectasis. He felt better with oxygen use and will use it at home since he was hypoxic on exertion. He had Pulmonary consult and PFT and follow up are arranged.  His amlodipine was discontinued for low blood pressure. He will see primary care in 2 weeks and see me in 1 week.   Consults: cardiology and pulmonary/intensive care  Significant Diagnostic Studies: labs: Mild hypochromic microcytic anemia, anemia panel pending. Hgb A1C pending for hyperglycemia. BMET near normal with mild hyperglycemia. Elevated sedimentation rate. Rheumatoid factor and ANA tests are pending.   Pulmonary function test is pending.  EKG: Sinus rhythm, Left axix deviation and IVCD on admission, Post catheterization : Acute anterior wall  MI.  Chest x-ray: Interstitial lung disease with possible infection.  CT angio chest : negative for pulmonary embolus but interstitial lung disease.  High resolution Ct chest showed interstitial lung disease with pneumonia, COPD and bronchiectasis.  Nuclear stress test : Inferior wall ischemia with 44 % EF.  Echocardiogram: Mild LVH with mild systolic dysfunction with EF 40-45 %, Mild to moderate TR.  Cardiac cath: Mid to distal LAD 100 % stenosed. Proximal Cx, 30 % stenosed. Diagonal 2 Osteal to proximal 90 % stenosed. Normal LVEDP and RV pressures.  Treatments: cardiac meds: Metoprolol, losartan, aspirin, Brilinta, Tamsulocin, Gemfibrozil and atorvastatin.  Discharge Exam: Blood pressure (!) 93/51, pulse 99, temperature 98 F (36.7 C), temperature source Oral, resp. rate 18, height  (1.651 m), weight 84 kg (185 lb 3.2 oz), SpO2 96 %. General appearance: alert, cooperative and appears stated age. Head: Normocephalic, atraumatic. Eyes: Brown eyes, pale pink conjunctiva, corneas clear. PERRL, EOM's intact.  Neck: No adenopathy, no carotid bruit, no JVD, supple, symmetrical, trachea midline and thyroid not enlarged. Resp: Fine crackles to auscultation bilaterally. Cardio: Regular rate and rhythm, S1, S2 normal, II/VI systolic murmur, no click, rub or gallop. GI: Soft, non-tender; bowel sounds normal; no organomegaly. Extremities: No edema, cyanosis or clubbing. Positive bilateral knee swelling and tenderness. Skin: Warm and dry.  Neurologic: Alert and oriented X 3, normal strength and tone. Normal coordination and slow gait due to knee arthritis.  Disposition: 01- Home, self care  Discharge Instructions    For home use only DME oxygen    Complete by:  As directed    Mode or (Route):  Nasal cannula   Liters per Minute:  2   Frequency:  Continuous (stationary and portable oxygen unit needed)   Oxygen conserving device:  Yes   Oxygen delivery system:  Gas     Allergies as of  11/09/2016   No Known Allergies     Medication List    STOP taking these medications   amLODipine 10 MG tablet Commonly known as:  NORVASC   diclofenac 75 MG EC tablet Commonly known as:  VOLTAREN     TAKE these medications   aspirin 81 MG EC tablet Take 1 tablet (81 mg total) by mouth daily. Start taking on:  11/10/2016   atorvastatin 80 MG tablet Commonly known as:  LIPITOR Take 1 tablet (80 mg total) by mouth daily at 6 PM.   azithromycin 500 MG tablet Commonly known as:  ZITHROMAX Take 1 tablet (500 mg total) by mouth daily. Start taking on:  11/10/2016   gemfibrozil 600 MG tablet Commonly known as:  LOPID TAKE 1 TABLET BY MOUTH 2 TIMES DAILY 30 MINUTES BEFORE morning and evening meal   guaiFENesin-dextromethorphan 100-10 MG/5ML syrup Commonly known as:  ROBITUSSIN DM Take 5 mLs by mouth every 4 (four) hours as needed for cough.   Ipratropium-Albuterol 20-100 MCG/ACT Aers respimat Commonly known as:  COMBIVENT Inhale 1 puff into the lungs every 6 (six) hours.   losartan 25 MG tablet Commonly known as:  COZAAR Take 1 tablet (25 mg total) by mouth daily. Start taking on:  11/10/2016   metoprolol tartrate 25 MG tablet Commonly known as:  LOPRESSOR Take 1 tablet (25 mg total) by mouth 2 (two) times daily.   nitroGLYCERIN 0.4 MG SL tablet Commonly known as:  NITROSTAT Place 1 tablet (0.4 mg total) under the tongue every 5 (five) minutes x 3 doses as needed for chest pain.   pantoprazole 40 MG tablet Commonly known as:  PROTONIX Take 1 tablet (40 mg total) by mouth daily at 6 (six) AM. Start taking on:  11/10/2016   tamsulosin 0.4 MG Caps capsule Commonly known as:  FLOMAX Take 1 capsule (0.4 mg total) by mouth daily after supper. What changed:  when to take this   ticagrelor 90 MG Tabs tablet Commonly known as:  BRILINTA Take 1 tablet (90 mg total) by mouth 2 (two) times daily.            Durable Medical Equipment        Start     Ordered    11/09/16 0000  For home use only DME oxygen    Question Answer Comment  Mode or (Route) Nasal cannula   Liters per Minute 2   Frequency Continuous (stationary and portable oxygen unit needed)   Oxygen conserving device Yes   Oxygen delivery system Gas      11/09/16 1739     Follow-up Information    EDWARDS, MICHELLE P, NP. Schedule an appointment as soon as possible for a visit in 2 week(s).   Specialty:  Internal Medicine Contact information: [address] [place] [address] [phone]        Ricki Rodriguez, MD. Schedule an appointment as soon as possible for a visit in 1 week(s).   Specialty:  Cardiology Contact information: [address] [place] [address] [phone]        Oretha Milch., MD Follow up on 11/18/2016.   Specialty:  Pulmonary Disease Why:  2:00PM [facility] - 2nd floor   Contact information: [address] [place] [address] [phone]        [facility] Follow up on 11/16/2016.   Specialty:  Respiratory Therapy Why:  PFT 3:00 PM - [facility] therpay department. No smoking, caffeine, or breathing treatment medications for 4 hours prior to testing. Contact information: [address] [id number] mc [place] [address] [phone]          Signed: Ricki Rodriguez 11/09/2016, 5:41 PM

## 2016-11-09 NOTE — Progress Notes (Signed)
Patient Saturations on Room Air at Rest = 97%  Patient Saturations on Room Air while Ambulating = 92%   During this ambulation, pt maintained his O2 saturation above 92% and did not need supplement oxygen.

## 2016-11-09 NOTE — Evaluation (Signed)
Physical Therapy Evaluation Patient Details Name: Norman Baker MRN: [id number] DOB: 10/28/1940 Today's Date: 11/09/2016   History of Present Illness  76 y.o. male who presented with chest pain and SOB and found to have occlusion of LAD and had stent placed. During stent placement, pt with acute MI of anterior wall. PMH significant for COPD, HTN, B knee OA, dyslipidemia, anemia, mild ascending aortic dilation and hyponatremia.   Clinical Impression  Pt presents with decreased strength, balance and activity tolerance secondary to above. PTA pt was mod-I with use of SPC, however, now requires RW and supervision for all mobility. Recommend d/c home with HHPT for safe transition home when medically ready. Acute PT will follow.     Follow Up Recommendations Home health PT;Supervision/Assistance - 24 hour    Equipment Recommendations  Rolling walker with 5" wheels    Recommendations for Other Services       Precautions / Restrictions Precautions Precautions: Fall Restrictions Weight Bearing Restrictions: No      Mobility  Bed Mobility Overal bed mobility: Needs Assistance Bed Mobility: Supine to Sit     Supine to sit: Supervision     General bed mobility comments: supervision for safety. no verbal cues or physical assist required. increased time and effort  Transfers Overall transfer level: Needs assistance Equipment used: Straight cane Transfers: Sit to/from Stand Sit to Stand: Min guard         General transfer comment: min guard for safety, however, no verbal cues or physical assist required. increased time and effort. use of SPC in RUE  Ambulation/Gait Ambulation/Gait assistance: Min guard Ambulation Distance (Feet): 120 Feet Assistive device: Rolling walker (2 wheeled) Gait Pattern/deviations: Step-through pattern;Decreased stride length;Trunk flexed;Wide base of support Gait velocity: decreased Gait velocity interpretation: Below normal speed for  age/gender General Gait Details: slow speed. bow legged B LE. verbal cues for upright posture. Son followed during amb for interpreting. Pt requested to stop amb due to L knee pain. min guard for safety, however, no physical assist required.   Stairs Stairs: Yes Stairs assistance: Min guard Stair Management: Step to pattern;Forwards;Two rails Number of Stairs: 5 General stair comments: min guard for safety, however, no physical assist required. increased time and effort with exacerbation of L knee pain.   Wheelchair Mobility    Modified Rankin (Stroke Patients Only)       Balance Overall balance assessment: Needs assistance Sitting-balance support: Feet supported;No upper extremity supported Sitting balance-Leahy Scale: Good Sitting balance - Comments: able to sit EOB and don socks without LOB   Standing balance support: Single extremity supported;Bilateral upper extremity supported Standing balance-Leahy Scale: Poor Standing balance comment: able to stand static with 1UE assist, however, reliant on RW for ambulation                             Pertinent Vitals/Pain Pain Assessment: Faces Faces Pain Scale: Hurts a little bit Pain Location: chest Pain Descriptors / Indicators: Discomfort;Grimacing Pain Intervention(s): Limited activity within patient's tolerance;Monitored during session;Repositioned    Home Living Family/patient expects to be discharged to:: Private residence Living Arrangements: Children Available Help at Discharge: Family;Available 24 hours/day Type of Home: Apartment Home Access: Stairs to enter   Entrance Stairs-Number of Steps: 3 Home Layout: One level Home Equipment: Cane - single point Additional Comments: Pt lives with son who works 8 hour days. Pt daughter-in-law can provide supervision, however, unsure if she can provide physical assist    Prior  Function Level of Independence: Independent with assistive device(s)          Comments: use of cane     Hand Dominance   Dominant Hand: Right    Extremity/Trunk Assessment   Upper Extremity Assessment Upper Extremity Assessment: Overall WFL for tasks assessed    Lower Extremity Assessment Lower Extremity Assessment: Overall WFL for tasks assessed (very bow legged bilaterally)    Cervical / Trunk Assessment Cervical / Trunk Assessment: Kyphotic  Communication   Communication: Prefers language other than English  Cognition Arousal/Alertness: Awake/alert Behavior During Therapy: WFL for tasks assessed/performed Overall Cognitive Status: Within Functional Limits for tasks assessed                                        General Comments General comments (skin integrity, edema, etc.): Pt O2 sats down to 88% following amb, however, recovered to 90% within 1 minute. Noted SOB after amb of about 20' sats were 91% on RA.     Exercises     Assessment/Plan    PT Assessment Patient needs continued PT services  PT Problem List Decreased activity tolerance;Decreased balance;Decreased mobility;Decreased knowledge of use of DME       PT Treatment Interventions DME instruction;Gait training;Stair training;Functional mobility training;Therapeutic activities;Therapeutic exercise;Balance training;Neuromuscular re-education;Patient/family education    PT Goals (Current goals can be found in the Care Plan section)  Acute Rehab PT Goals Patient Stated Goal: none stated PT Goal Formulation: Patient unable to participate in goal setting    Frequency Min 3X/week   Barriers to discharge        Co-evaluation               End of Session Equipment Utilized During Treatment: Gait belt Activity Tolerance: Patient tolerated treatment well Patient left: in chair;Other (comment);with family/visitor present (with OT) Nurse Communication: Mobility status PT Visit Diagnosis: Unsteadiness on feet (R26.81);Other abnormalities of gait and mobility  (R26.89);Difficulty in walking, not elsewhere classified (R26.2)    Time: 1610-9604 PT Time Calculation (min) (ACUTE ONLY): 20 min   Charges:   PT Evaluation $PT Eval Moderate Complexity: 1 Procedure     PT G CodesLane Hacker, SPT Acute Rehab SPT [phone]    Lane Hacker 11/09/2016, 9:56 AM

## 2016-11-09 NOTE — Progress Notes (Signed)
RT unable to complete the PFT today as ordered. Mikey Bussing, NP notified. Will set outpatient PFT.

## 2016-11-09 NOTE — Progress Notes (Signed)
Occupational Therapy Evaluation Patient Details Name: Norman Baker MRN: [id number] DOB: 06/30/1941 Today's Date: 11/09/2016    History of Present Illness 76 y.o. male who presented with chest pain and SOB and found to have occlusion of LAD and had stent placed. During stent placement, pt with acute MI of anterior wall. PMH significant for COPD, HTN, B knee OA, dyslipidemia, anemia, mild ascending aortic dilation and hyponatremia.    Clinical Impression   PTA, pt mod I with ADL and mobility @ straight cane level. Pt desats to 84 on RA during ADL tasks. Returns to 92 with pursed lip breathing on RA. Began education on energy conservation and need for use of shower chair when bathing. Feel pt will be safe to DC home with S @ RW level when medically stable. Will follow acutely to address established goals to facilitate a safe DC home.     Follow Up Recommendations  No OT follow up;Supervision/Assistance - 24 hour (initially)    Equipment Recommendations  Tub/shower seat    Recommendations for Other Services       Precautions / Restrictions Precautions Precautions: Fall Restrictions Weight Bearing Restrictions: No      Mobility Bed Mobility               General bed mobility comments: OOB with PT  Transfers Overall transfer level: Needs assistance Equipment used: Rolling walker (2 wheeled) Transfers: Sit to/from Stand Sit to Stand: Supervision              Balance Overall balance assessment: Needs assistance Sitting-balance support: Feet supported;No upper extremity supported Sitting balance-Leahy Scale: Good Sitting balance - Comments: able to sit EOB and don socks without LOB   Standing balance support: Single extremity supported;Bilateral upper extremity supported Standing balance-Leahy Scale: Poor Standing balance comment: able to stand static with 1UE assist, however, reliant on RW for ambulation                           ADL either performed or  assessed with clinical judgement   ADL Overall ADL's : Needs assistance/impaired     Grooming: Set up;Supervision/safety;Standing   Upper Body Bathing: Set up;Supervision/ safety;Standing   Lower Body Bathing: Set up;Supervison/ safety;Sit to/from stand   Upper Body Dressing : Set up;Supervision/safety;Sitting   Lower Body Dressing: Supervision/safety;Set up;Sit to/from stand   Toilet Transfer: Supervision/safety;Ambulation;RW;Comfort height toilet   Toileting- Clothing Manipulation and Hygiene: Modified independent       Functional mobility during ADLs: Supervision/safety;Rolling walker;Cueing for safety General ADL Comments: Fatigues during ADL. Desat to 84 RA. 2/4 dypsnea. Educated son/pt on need to sit and conserve energy when bathing. Also recommend use of shower chair     Vision         Perception     Praxis      Pertinent Vitals/Pain Pain Assessment: Faces Faces Pain Scale: Hurts a little bit Pain Location: chest Pain Descriptors / Indicators: Discomfort;Grimacing Pain Intervention(s): Limited activity within patient's tolerance;Monitored during session;Repositioned     Hand Dominance Right   Extremity/Trunk Assessment Upper Extremity Assessment Upper Extremity Assessment: Overall WFL for tasks assessed   Lower Extremity Assessment Lower Extremity Assessment:  (c/o pain B knees)   Cervical / Trunk Assessment Cervical / Trunk Assessment: Kyphotic (forward head)   Communication Communication Communication: Prefers language other than English   Cognition Arousal/Alertness: Awake/alert Behavior During Therapy: WFL for tasks assessed/performed Overall Cognitive Status: Within Functional Limits for tasks assessed  General Comments       Exercises     Shoulder Instructions      Home Living Family/patient expects to be discharged to:: Private residence Living Arrangements: Children Available Help  at Discharge: Family;Available 24 hours/day Type of Home: Apartment Home Access: Stairs to enter Entrance Stairs-Number of Steps: 3   Home Layout: One level     Bathroom Shower/Tub: Tub/shower unit;Curtain   Firefighter: Standard     Home Equipment: Cane - single point   Additional Comments: Pt lives with son who works 8 hour days. Pt daughter-in-law can provide supervision, however, unsure if she can provide physical assist      Prior Functioning/Environment Level of Independence: Independent with assistive device(s)        Comments: use of cane        OT Problem List: Decreased activity tolerance;Decreased knowledge of use of DME or AE;Decreased safety awareness;Pain      OT Treatment/Interventions: Self-care/ADL training;Energy conservation;DME and/or AE instruction;Therapeutic activities;Patient/family education    OT Goals(Current goals can be found in the care plan section) Acute Rehab OT Goals Patient Stated Goal: none stated OT Goal Formulation: With patient/family Time For Goal Achievement: 11/23/16 Potential to Achieve Goals: Good  OT Frequency: Min 2X/week   Barriers to D/C:            Co-evaluation              End of Session Equipment Utilized During Treatment: Engineer, water Communication: Mobility status;Other (comment) (O2 desat/monitor O2)  Activity Tolerance: Patient tolerated treatment well Patient left: in chair;with call bell/phone within reach;with family/visitor present  OT Visit Diagnosis: Muscle weakness (generalized) (M62.81)                Time: 0830-0900 OT Time Calculation (min): 30 min Charges:  OT General Charges $OT Visit: 1 Procedure OT Evaluation $OT Eval Moderate Complexity: 1 Procedure OT Treatments $Self Care/Home Management : 8-22 mins G-Codes:     [facility], OT/L  [phone] 11/09/2016  Norman Baker,HILLARY 11/09/2016, 10:59 AM

## 2016-11-09 NOTE — Consult Note (Signed)
Name: Norman Baker MRN: [id number] DOB: Aug 11, 1940    ADMISSION DATE:  11/01/2016 CONSULTATION DATE:  11/01/2016  REFERRING MD :  Dr. Algie Coffer  CHIEF COMPLAINT:  Fibrotic changes on CT chest  HISTORY OF PRESENT ILLNESS:  76 year old male with PMH as below, which is significant for HTN, remote smoking history, and arthritis. He is originally from the [place], but has lived in [place] for short stretches for work. He has been in the [place] for about 2 years now. He lives in an apartment with his son. 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 He was admitted by Dr. Algie Coffer for a 2 month history of recurrent chest pain and dyspnea on 4/9 when he was found to be hypoxemic in the office. He was admitted with suspicion for ACS vs PE and was started on a heparin infusion. CTA for PE was negative and he underwent a stress test, which was abnormal. This prompted LHC demonstrating critical stenosis of LAD (100%) and 1st Diagonal (90%). Since the time of the cath he had improved, but remained dyspneic and hypoxemic. HRCT of the chest was done and demonstrated and pattern consistent with pulmonary fibrosis, more specifically UIP. Pulmonary asked to see.   SIGNIFICANT EVENTS  4/9 admit 4/13 LHC with critical stenosis to LAD and diag post stents  STUDIES:  CTA chest 4/10 > No evidence of pulmonary embolus. Bilateral peripheral fibrotic change, numerous scattered blebs and interstitial prominence, suspicious for chronic interstitial lung disease. This likely explains the patient's symptoms. No superimposed focal airspace consolidation seen. High-resolution CT of the chest would be helpful for further evaluation, to better characterize the nature of the interstitial lung disease. Partially calcified mediastinal nodes, measuring up to 1.3 cm in short axis. 4. Mild mural thrombus along descending thoracic aorta,  with scattered calcification. Stress test 4/11 > Inducible ischemia along the inferior wall of the left ventricle. Hypokinesia and decreased thickening along the inferior wall of the left ventricle. Left ventricular ejection fraction 44%. -  High risk Echo 4/13 > LVEF 40-45% Hypokinesis of the mid-apicalanteroseptal,  anterior, and apical myocardium; consistent with ischemia in the distribution of the left anterior descending coronary artery. 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 Repeat high-resolution chest CT is recommended in 12 months to assess for temporal changes in the appearance of the lung parenchyma. 2. In addition, there is diffuse bronchial wall thickening with mild to moderate centrilobular emphysema; imaging features suggesting concurrent COPD. 3. Aortic atherosclerosis, in addition to left main and 3 vessel coronary artery disease. Assessment for potential risk factor modification, dietary therapy or pharmacologic therapy may be warranted, if clinically indicated.   PAST MEDICAL HISTORY :   has a past medical history of Arthritis; Dyspnea; and Hypertension.  has a past surgical history that includes Hemorroidectomy; Right/Left Heart Cath and Coronary Angiography (N/A, 11/04/2016); Coronary Stent Intervention (N/A, 11/04/2016); and Coronary Stent Intervention (N/A, 11/04/2016). Prior to Admission medications   Medication Sig Start Date End Date Taking? Authorizing Provider  amLODipine (NORVASC) 10 MG tablet Take 10 mg by mouth daily. 10/25/16  Yes Historical Provider, MD  diclofenac (VOLTAREN) 75 MG EC tablet Take 75 mg by mouth 2 (two) times daily. 10/26/16  Yes Historical  Provider, MD  gemfibrozil (LOPID) 600 MG tablet TAKE 1 TABLET BY MOUTH 2 TIMES  DAILY 30 MINUTES BEFORE morning and evening meal 10/26/16  Yes Historical Provider, MD  tamsulosin (FLOMAX) 0.4 MG CAPS capsule Take 0.4 mg by mouth daily. 10/26/16  Yes Historical Provider, MD   No Known Allergies  FAMILY HISTORY:  family history is not on file. SOCIAL HISTORY:  reports that he has quit smoking. He has never used smokeless tobacco. He reports that he does not drink alcohol or use drugs.  REVIEW OF SYSTEMS:  Gained via son Bolds are positive  Constitutional: weight loss, gain, night sweats, Fevers, chills, fatigue .  HEENT: headaches, Sore throat, sneezing, nasal congestion, post nasal drip, Difficulty swallowing, Tooth/dental problems, visual complaints visual changes, ear ache CV:  chest pain, radiates:,Orthopnea, PND, swelling in lower extremities, dizziness, palpitations, syncope.  GI  heartburn, indigestion, abdominal pain, nausea, vomiting, diarrhea, change in bowel habits, loss of appetite, bloody stools.  Resp: cough, productive:scant clear secretions, hemoptysis, dyspnea, chest pain, pleuritic.  Skin: rash or itching or icterus GU: dysuria, change in color of urine, urgency or frequency. flank pain, hematuria  MS: joint pain or swelling. decreased range of motion  Psych: change in mood or affect. depression or anxiety.  Neuro: difficulty with speech, weakness, numbness, ataxia     SUBJECTIVE:   VITAL SIGNS: Temp:  [98 F (36.7 C)-98.2 F (36.8 C)] 98 F (36.7 C) (04/17 0438) Pulse Rate:  [78-102] 102 (04/17 0931) Resp:  [18] 18 (04/17 0438) BP: (101-124)/(54-77) 101/61 (04/17 0931) SpO2:  [94 %-97 %] 97 % (04/17 0438)  PHYSICAL EXAMINATION: General:  Elderly male of normal body habitus in NAD Neuro:  Alert, oriented, per son. Follows prompts HEENT:  [place]/AT, PERRL, no JVD Cardiovascular:  RRR, no MRG Lungs:  Clear bilateral breath sounds Abdomen:  Soft, non-tender, non-distended Musculoskeletal:  No acute deformity or ROM limitation Skin:  Grossly  intact   Recent Labs Lab 11/06/16 0635 11/07/16 0220 11/08/16 0313  NA 137 135 133*  K 4.2 3.7 3.8  CL 103 104 105  CO2 22 23 20*  BUN 18 24* 26*  CREATININE 1.09 1.23 1.12  GLUCOSE 114* 135* 121*    Recent Labs Lab 11/05/16 0254 11/07/16 0220 11/08/16 0313  HGB 11.2* 9.9* 10.4*  HCT 34.4* 30.5* 31.8*  WBC 13.6* 12.4* 10.9*  PLT 383 323 360   No results found.  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 - Will need home O2 for exertion based on walk test results. - Reasonable to discharge today and continue workup as an outpatient. - Consider Esbriet, may be better to address pros/cons of this in outpatient setting. - Day 4 of azithromycin, would discontinue at discharge - Will arrange follow-up in our clinic.  NSTEMI - Per primary team.  Joneen Roach, AGACNP-BC [facility] Pager [phone] or [phone]  11/09/2016 11:06 AM

## 2016-11-09 NOTE — Progress Notes (Signed)
SATURATION QUALIFICATIONS: (This note is used to comply with regulatory documentation for home oxygen)  Patient Saturations on Room Air at Rest = 94%  Patient Saturations on Room Air while Ambulating = 88 %  Patient Saturations on 2 Liters of oxygen while Ambulating = 97%  Please briefly explain why patient needs home oxygen: pt feels SOB with activity and oxygen dropped to 88% on RA.

## 2016-11-09 NOTE — Progress Notes (Signed)
D/c instructions discussed with pt and his son; all questions answered and they verbalized understanding.

## 2016-11-09 NOTE — Progress Notes (Signed)
CARDIAC REHAB PHASE I   Pt just returned from ambulating with RN (second walk today). Will follow up tomorrow.   Joylene Grapes, RN, BSN 11/09/2016 2:54 PM

## 2016-11-10 LAB — RHEUMATOID FACTOR: Rhuematoid fact SerPl-aCnc: <10 IU/mL (ref 0.0–13.9)

## 2016-11-10 LAB — ANTINUCLEAR ANTIBODIES, IFA: ANTINUCLEAR ANTIBODIES, IFA: NEGATIVE

## 2016-11-16 ENCOUNTER — Ambulatory Visit (HOSPITAL_COMMUNITY)
Admission: RE | Admit: 2016-11-16 | Discharge: 2016-11-16 | Disposition: A | Discharge status: Home / Self Care | Payer: Medicaid Other | Source: Ambulatory Visit | Attending: Pulmonary Disease | Admitting: Pulmonary Disease

## 2016-11-16 DIAGNOSIS — J84112 Idiopathic pulmonary fibrosis: Secondary | ICD-10-CM | POA: Insufficient documentation

## 2016-11-16 LAB — PULMONARY FUNCTION TEST
DL/VA % pred: 54 %
DL/VA: 2.32 ml/min/mmHg/L
DLCO COR: 9.48 ml/min/mmHg
DLCO UNC: 8.39 ml/min/mmHg
DLCO cor % pred: 37 %
DLCO unc % pred: 32 %
FEF 25-75 POST: 4.64 L/s
FEF 25-75 Pre: 4.02 L/sec
FEF2575-%Change-Post: 15 %
FEF2575-%PRED-PRE: 227 %
FEF2575-%Pred-Post: 262 %
FEV1-%Change-Post: 8 %
FEV1-%PRED-POST: 117 %
FEV1-%PRED-PRE: 107 %
FEV1-POST: 2.56 L
FEV1-Pre: 2.35 L
FEV1FVC-%CHANGE-POST: 1 %
FEV1FVC-%Pred-Pre: 119 %
FEV6-%CHANGE-POST: 7 %
FEV6-%PRED-POST: 100 %
FEV6-%Pred-Pre: 93 %
FEV6-POST: 2.81 L
FEV6-PRE: 2.62 L
FEV6FVC-%PRED-POST: 106 %
FEV6FVC-%PRED-PRE: 106 %
FVC-%Change-Post: 7 %
FVC-%PRED-POST: 93 %
FVC-%Pred-Pre: 87 %
FVC-Post: 2.81 L
FVC-Pre: 2.62 L
POST FEV6/FVC RATIO: 100 %
PRE FEV1/FVC RATIO: 90 %
Post FEV1/FVC ratio: 91 %
Pre FEV6/FVC Ratio: 100 %
RV % pred: 65 %
RV: 1.51 L
TLC % PRED: 77 %
TLC: 4.69 L

## 2016-11-16 MED ORDER — ALBUTEROL SULFATE (2.5 MG/3ML) 0.083% IN NEBU
2.5000 mg | INHALATION_SOLUTION | Freq: Once | RESPIRATORY_TRACT | Status: AC
  Administered 2016-11-16: 2.5 mg via RESPIRATORY_TRACT

## 2016-11-18 ENCOUNTER — Encounter: Payer: Self-pay | Admitting: Pulmonary Disease

## 2016-11-18 ENCOUNTER — Ambulatory Visit (INDEPENDENT_AMBULATORY_CARE_PROVIDER_SITE_OTHER): Payer: No Typology Code available for payment source | Admitting: Pulmonary Disease

## 2016-11-18 ENCOUNTER — Telehealth: Payer: Self-pay | Admitting: Pulmonary Disease

## 2016-11-18 VITALS — BP 118/74 | HR 89 | Ht 65.0 in | Wt 182.2 lb

## 2016-11-18 DIAGNOSIS — J84112 Idiopathic pulmonary fibrosis: Secondary | ICD-10-CM

## 2016-11-18 DIAGNOSIS — M170 Bilateral primary osteoarthritis of knee: Secondary | ICD-10-CM | POA: Insufficient documentation

## 2016-11-18 MED ORDER — PREDNISONE 5 MG PO TABS: 5.0000 mg | ORAL_TABLET | Freq: Every day | ORAL | 0 refills | Status: AC

## 2016-11-18 MED ORDER — HYDROCODONE-HOMATROPINE 5-1.5 MG/5ML PO SYRP: 5.0000 mL | ORAL_SOLUTION | Freq: Two times a day (BID) | ORAL | 0 refills | Status: AC

## 2016-11-18 NOTE — Patient Instructions (Signed)
You have pulmonary fibrosis Lung function shows diffusion capacity is at 32%  Trial of codeine cough syrup 5ml twice daily as needed x 120 ml -This may cause sleepiness  Prednisone 5 mg daily x 2 weeks  Okay to take extra strength Tylenol twice daily for knee pain Referral to [facility] orthopedics

## 2016-11-18 NOTE — Assessment & Plan Note (Signed)
Okay to take extra strength Tylenol twice daily for knee pain Referral to [facility] orthopedics

## 2016-11-18 NOTE — Telephone Encounter (Signed)
Pt Son brought in 2017 Tax return forms and these were placed in RA's box -- Cherina now has these.   Dr Vassie Loll, please advise what this was intended for. Are we initiating Patient Assistance for a medication or did you need this? Thanks.

## 2016-11-18 NOTE — Assessment & Plan Note (Addendum)
Lung function shows diffusion capacity is at 32% HRCT appearances typical for IPF  For cough not relieved by over-the-counter cough syrup, Trial of codeine cough syrup 5ml twice daily as needed x 120 ml -This may cause sleepiness  Prednisone 5 mg daily x 2 weeks  Not sure we will be able to get in 90 fibrotic therapy due to insurance issues

## 2016-11-18 NOTE — Progress Notes (Signed)
   Subjective:    Patient ID: Norman Baker, male    DOB: [birth year], 76 y.o.   MRN: [id number]  HPI  76 year old [place] immigrant,remote smoker, has lived in [place]  Was admitted 10/2016 with 2 months of chest pain and dyspnea as acute coronary syndrome, required stent to LAD and first diagonal Presents as post hospital consult for follow-up His chest pain is subsided, continues to have dyspnea. Cough remains productive of clear white sputum, not relieved with Robitussin-DM which he was prescribed on discharge He is limited in his walking due to severe osteoarthritis in both knees, minutes with a cane. He desaturates to 94% on ambulating only one lap around the office  Accompanied by his son who translates  Significant tests/ events  Ana neg, RA facor neg, ESR 55  HRCT chest  11/05/16 >>  shows craniocaudal component, with subpleural reticular changes and some honeycombing which would be typical for IPF. Mild emphysema, enlarged hilar and mediastinal lymphadenopathy with calcifications, calcified granulomas in the liver  PFTs 10/2016-no airway obstruction, TLC 77%, DLCO 32%, corrects to 54% for alveolar volume  Past Medical History:  Diagnosis Date  . Arthritis   . Dyspnea   . Hypertension      Review of Systems neg for any significant sore throat, dysphagia, itching, sneezing, nasal congestion or excess/ purulent secretions, fever, chills, sweats, unintended wt loss, pleuritic or exertional cp, hempoptysis, orthopnea pnd or change in chronic leg swelling. Also denies presyncope, palpitations, heartburn, abdominal pain, nausea, vomiting, diarrhea or change in bowel or urinary habits, dysuria,hematuria, rash, arthralgias, visual complaints, headache, numbness weakness or ataxia.     Objective:   Physical Exam  Gen. Pleasant, well-nourished, in no distress, normal affect ENT - no lesions, no post nasal drip Neck: No JVD, no thyromegaly, no carotid bruits Lungs: no use of  accessory muscles, no dullness to percussion,bibasal rales, no rhonchi  Cardiovascular: Rhythm regular, heart sounds  normal, no murmurs or gallops, no peripheral edema Abdomen: soft and non-tender, no hepatosplenomegaly, BS normal. Musculoskeletal: No deformities, no cyanosis or clubbing Neuro:  alert, non focal       Assessment & Plan:

## 2016-11-18 NOTE — Telephone Encounter (Signed)
Form has been placed in RA's look at.

## 2016-11-21 NOTE — Telephone Encounter (Signed)
I did not request Pl initiate pt assistance for Ofev

## 2016-11-22 NOTE — Telephone Encounter (Signed)
Paperwork has been started.

## 2016-11-22 NOTE — Telephone Encounter (Signed)
Forwarding to Cherina to follow up on.

## 2016-11-23 ENCOUNTER — Ambulatory Visit (INDEPENDENT_AMBULATORY_CARE_PROVIDER_SITE_OTHER): Payer: Self-pay

## 2016-11-23 ENCOUNTER — Ambulatory Visit (INDEPENDENT_AMBULATORY_CARE_PROVIDER_SITE_OTHER): Payer: Self-pay | Admitting: Orthopaedic Surgery

## 2016-11-23 ENCOUNTER — Encounter (INDEPENDENT_AMBULATORY_CARE_PROVIDER_SITE_OTHER): Payer: Self-pay | Admitting: Orthopaedic Surgery

## 2016-11-23 DIAGNOSIS — M25562 Pain in left knee: Secondary | ICD-10-CM

## 2016-11-23 DIAGNOSIS — M25561 Pain in right knee: Secondary | ICD-10-CM

## 2016-11-23 DIAGNOSIS — G8929 Other chronic pain: Secondary | ICD-10-CM

## 2016-11-23 NOTE — Progress Notes (Signed)
Office Visit Note   Patient: Norman Baker           Date of Birth: 1940-11-30           MRN: [id number] Visit Date: 11/23/2016              Requested by: Grayce Sessions, NP [address] [place], [place] [address] PCP: Grayce Sessions, NP   Assessment & Plan: Visit Diagnoses:  1. Chronic pain of both knees     Plan: I reviewed the x-rays with the patient and he has severe degenerative joint disease which would not respond well to conservative treatment. He does need a knee replacement however he recently had his cardiac cath with stenting.  He would need to come off of the blood thinners in order to have the surgery. I recommend that he talk to Dr. Sharyn Lull at his next follow-up appointment to discuss when he would be a candidate for a total knee replacement.  I'll see him back as needed. I've given them my card.  Follow-Up Instructions: Return if symptoms worsen or fail to improve.   Orders:  Orders Placed This Encounter  Procedures  . XR KNEE 3 VIEW RIGHT  . XR KNEE 3 VIEW LEFT   No orders of the defined types were placed in this encounter.     Procedures: No procedures performed   Clinical Data: No additional findings.   Subjective: Chief Complaint  Patient presents with  . Right Knee - Pain  . Left Knee - Pain    Patient is a 76 year old gentleman who comes in with bilateral knee pain worse on the left knee for many years. He relates with a cane. He has severe pain and difficulty with ADLs. He has significant popping and cracking and giving way. He has worn a knee sleeve with our short relief. He has not had injections recently. He did have a cardiac cath with stenting just a couple weeks ago. He had a mild heart attack during the cardiac cath per the family member. He is currently on aspirin and Prolenta.      Review of Systems  Constitutional: Negative.   All other systems reviewed and are negative.    Objective: Vital Signs: There were no  vitals taken for this visit.  Physical Exam  Constitutional: He is oriented to person, place, and time. He appears well-developed and well-nourished.  HENT:  Head: Normocephalic and atraumatic.  Eyes: Pupils are equal, round, and reactive to light.  Neck: Neck supple.  Pulmonary/Chest: Effort normal.  Abdominal: Soft.  Musculoskeletal: Normal range of motion.  Neurological: He is alert and oriented to person, place, and time.  Skin: Skin is warm.  Psychiatric: He has a normal mood and affect. His behavior is normal. Judgment and thought content normal.  Nursing note and vitals reviewed.   Ortho Exam Bilateral knee exam shows flexion contracture. No joint effusion. Varus deformity. No swelling. Specialty Comments:  No specialty comments available.  Imaging: Xr Knee 3 View Left  Result Date: 11/23/2016 Severe degenerative joint disease with varus deformity and medial tibial plateau bone where  Xr Knee 3 View Right  Result Date: 11/23/2016 Severe degenerative joint disease with varus deformity.    PMFS History: Patient Active Problem List   Diagnosis Date Noted  . Osteoarthritis of both knees 11/18/2016  . IPF (idiopathic pulmonary fibrosis) (HCC) 11/01/2016  . Chest pain at rest 11/01/2016  . Chest pain 11/01/2016   Past Medical History:  Diagnosis  Date  . Arthritis   . Dyspnea   . Hypertension     No family history on file.  Past Surgical History:  Procedure Laterality Date  . CORONARY STENT INTERVENTION N/A 11/04/2016   Procedure: Coronary Stent Intervention;  Surgeon: Orpah Cobb, MD;  Location: [facility];  Service: Cardiovascular;  Laterality: N/A;  Entered in error.   . CORONARY STENT INTERVENTION N/A 11/04/2016   Procedure: Coronary Stent Intervention;  Surgeon: Rinaldo Cloud, MD;  Location: [facility];  Service: Cardiovascular;  Laterality: N/A;  . HEMORROIDECTOMY    . RIGHT/LEFT HEART CATH AND CORONARY ANGIOGRAPHY N/A 11/04/2016   Procedure:  Right/Left Heart Cath and Coronary Angiography;  Surgeon: Orpah Cobb, MD;  Location: [facility];  Service: Cardiovascular;  Laterality: N/A;   Social History   Occupational History  . Not on file.   Social History Main Topics  . Smoking status: Former Games developer  . Smokeless tobacco: Never Used  . Alcohol use No  . Drug use: No  . Sexual activity: Not on file

## 2016-11-29 NOTE — Telephone Encounter (Signed)
Any updates on forms.

## 2016-12-06 NOTE — Telephone Encounter (Signed)
Cherina, any update on forms?

## 2016-12-06 NOTE — Telephone Encounter (Signed)
Forms have been faxed. Will close this message.

## 2017-01-13 ENCOUNTER — Telehealth: Payer: Self-pay | Admitting: Pulmonary Disease

## 2017-01-13 NOTE — Telephone Encounter (Signed)
lmtcb x1 for pt's son. 

## 2017-01-14 MED ORDER — NINTEDANIB ESYLATE 150 MG PO CAPS: 150.0000 mg | ORAL_CAPSULE | Freq: Two times a day (BID) | ORAL | 11 refills | Status: AC

## 2017-01-14 NOTE — Telephone Encounter (Signed)
Left message for son to call back 

## 2017-01-14 NOTE — Telephone Encounter (Signed)
Ofev or esbriet whichever has pt assistance

## 2017-01-14 NOTE — Telephone Encounter (Signed)
Spoke with pt's son. States that RA wanted pt on a medication for his pulmonary fibrosis. We were to be working on patient assistance forms for this.  RA - please advise which medication you wanted pt on. Thanks.

## 2017-01-17 NOTE — Telephone Encounter (Signed)
LMTCB x2  

## 2017-01-18 NOTE — Telephone Encounter (Signed)
LMTCB x4. Will close this message. I will continue to hold the patient assistance forms for Ofev in my look-at folder if he ever decides to call back.

## 2017-01-18 NOTE — Telephone Encounter (Signed)
lmtcb x3 for pt's son. 

## 2017-02-17 ENCOUNTER — Ambulatory Visit: Payer: Medicaid Other | Admitting: Adult Health

## 2017-03-17 ENCOUNTER — Encounter (INDEPENDENT_AMBULATORY_CARE_PROVIDER_SITE_OTHER): Payer: Self-pay | Admitting: Orthopaedic Surgery

## 2017-03-17 ENCOUNTER — Ambulatory Visit (INDEPENDENT_AMBULATORY_CARE_PROVIDER_SITE_OTHER): Payer: BLUE CROSS/BLUE SHIELD | Admitting: Orthopaedic Surgery

## 2017-03-17 DIAGNOSIS — M170 Bilateral primary osteoarthritis of knee: Secondary | ICD-10-CM

## 2017-03-17 NOTE — Progress Notes (Signed)
   Office Visit Note   Patient: Norman Baker           Date of Birth: 04/03/1941           MRN: [id number] Visit Date: 03/17/2017              Requested by: Grayce Sessions, NP [address] [place], [place] [address] PCP: Grayce Sessions, NP   Assessment & Plan: Visit Diagnoses:  1. Primary osteoarthritis of both knees     Plan: Left knee was injected with cortisone today. Patient tolerated this well. He will give [place] a call back when he is ready to schedule surgery. In the meantime going to get cardiac clearance from his cardiologist while he is in the sedan.  Follow-Up Instructions: Return if symptoms worsen or fail to improve.   Orders:  No orders of the defined types were placed in this encounter.  No orders of the defined types were placed in this encounter.     Procedures: No procedures performed   Clinical Data: No additional findings.   Subjective: Chief Complaint  Patient presents with  . Left Knee - Pain  . Right Knee - Pain    Patient comes in today for continued left knee pain. He was told by his cardiologist Dr. Sharyn Lull that he was clear for total knee replacement. He is traveling to after for the next month and he would like to have the knee replacement once he returns.    Review of Systems   Objective: Vital Signs: There were no vitals taken for this visit.  Physical Exam  Ortho Exam Left knee exam is stable. Specialty Comments:  No specialty comments available.  Imaging: No results found.   PMFS History: Patient Active Problem List   Diagnosis Date Noted  . Osteoarthritis of both knees 11/18/2016  . IPF (idiopathic pulmonary fibrosis) (HCC) 11/01/2016  . Chest pain at rest 11/01/2016  . Chest pain 11/01/2016   Past Medical History:  Diagnosis Date  . Arthritis   . Dyspnea   . Hypertension     No family history on file.  Past Surgical History:  Procedure Laterality Date  . CORONARY STENT INTERVENTION N/A  11/04/2016   Procedure: Coronary Stent Intervention;  Surgeon: Orpah Cobb, MD;  Location: [facility];  Service: Cardiovascular;  Laterality: N/A;  Entered in error.   . CORONARY STENT INTERVENTION N/A 11/04/2016   Procedure: Coronary Stent Intervention;  Surgeon: Rinaldo Cloud, MD;  [facility];  Service: Cardiovascular;  Laterality: N/A;  . HEMORROIDECTOMY    . RIGHT/LEFT HEART CATH AND CORONARY ANGIOGRAPHY N/A 11/04/2016   Procedure: Right/Left Heart Cath and Coronary Angiography;  Surgeon: Orpah Cobb, MD;  [facility]: [facility];  Service: Cardiovascular;  Laterality: N/A;   Social History   Occupational History  . Not on file.   Social History Main Topics  . Smoking status: Former Games developer  . Smokeless tobacco: Never Used  . Alcohol use No  . Drug use: No  . Sexual activity: Not on file

## 2017-04-26 ENCOUNTER — Ambulatory Visit (INDEPENDENT_AMBULATORY_CARE_PROVIDER_SITE_OTHER): Payer: BLUE CROSS/BLUE SHIELD | Admitting: Orthopaedic Surgery

## 2018-12-29 IMAGING — CT CT CHEST HIGH RESOLUTION W/O CM
2 of 5 series · 14 of 36 positions shown, 17 images · non-contrast
Comparison: Chest CT 11/02/2016.

CLINICAL DATA: 76-year-old male with clinical suspicion for
interstitial lung disease.

EXAM:
CT CHEST WITHOUT CONTRAST
TECHNIQUE: Multidetector CT imaging of the chest was performed following the
standard protocol without intravenous contrast. High resolution
imaging of the lungs, as well as inspiratory and expiratory imaging,
was performed.

[Series 2: chest w/o 5mm st · axial · non-contrast · 0.74mm/px · z∈[-362,-92]mm · 11 of 156 slices shown, 14 images]
[im 14/156  mediastinal]
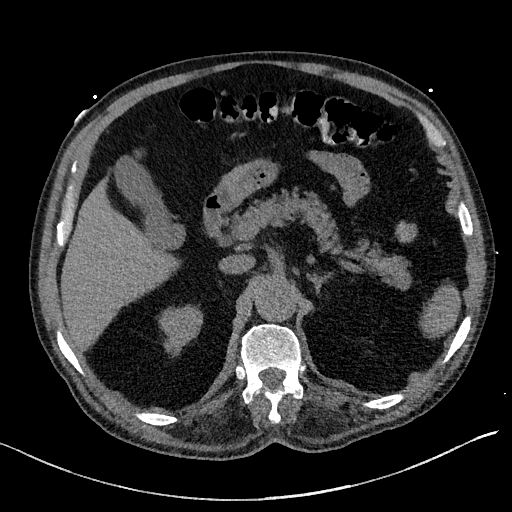
[im 14/156  lung]
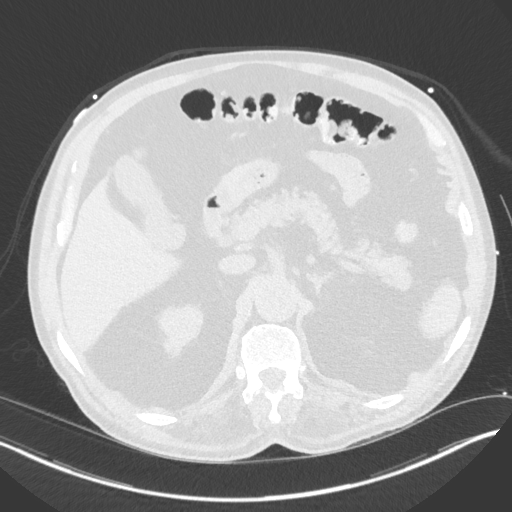
[im 27/156  lung]
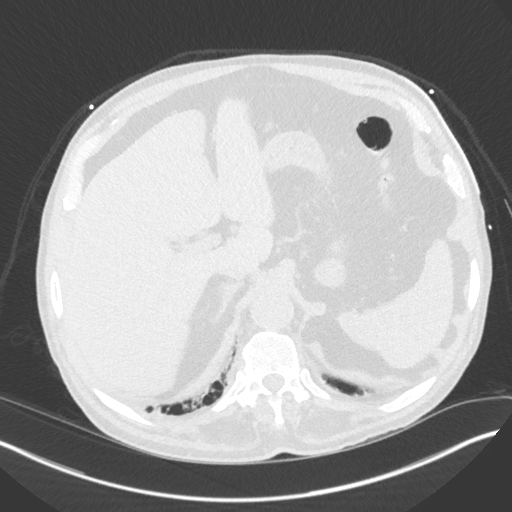
[im 41/156  lung]
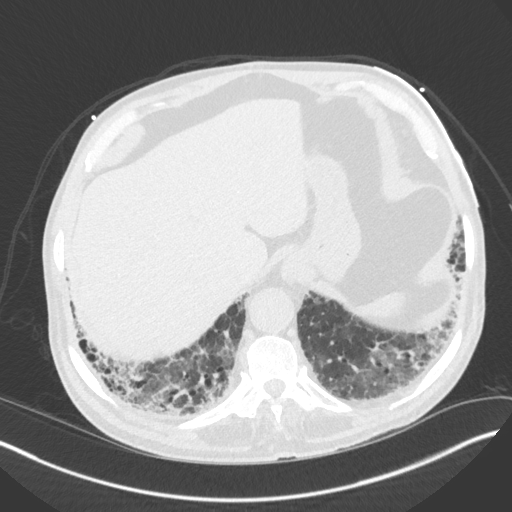
[im 54/156  lung]
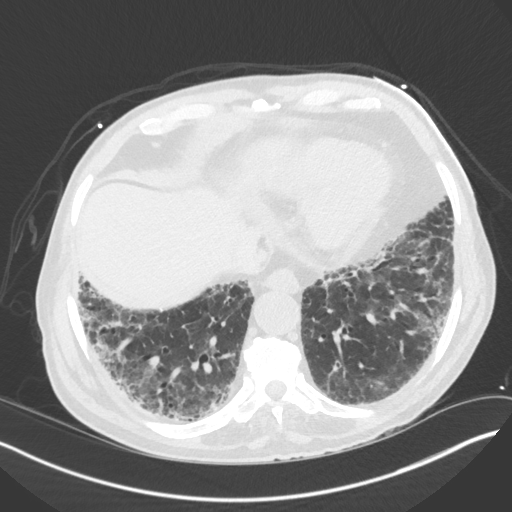
[im 68/156  mediastinal]
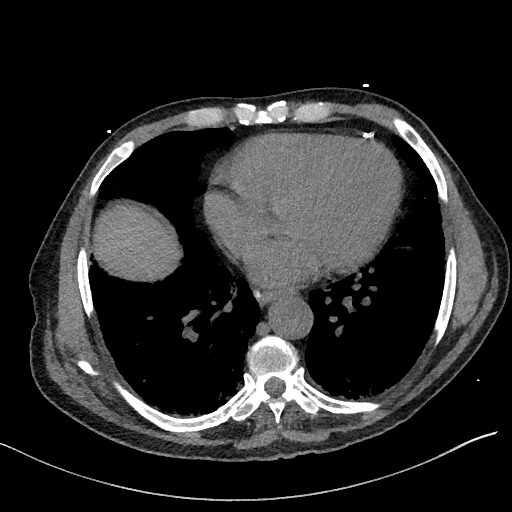
[im 68/156  lung]
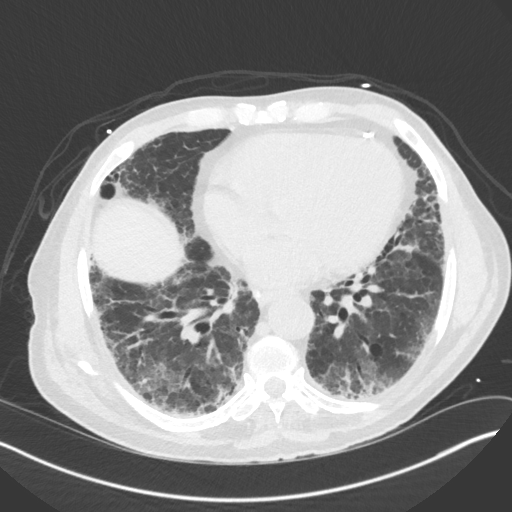
[im 81/156  lung]
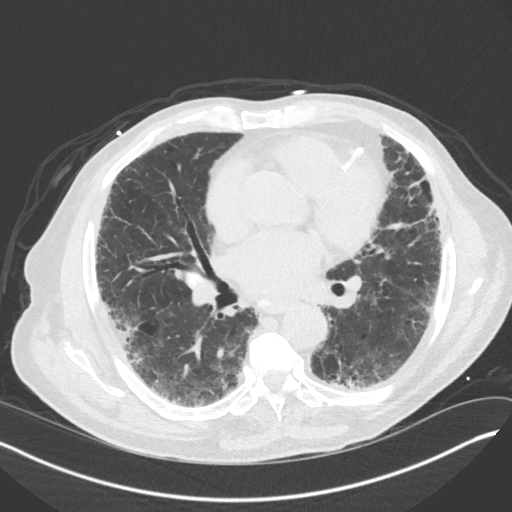
[im 95/156  lung]
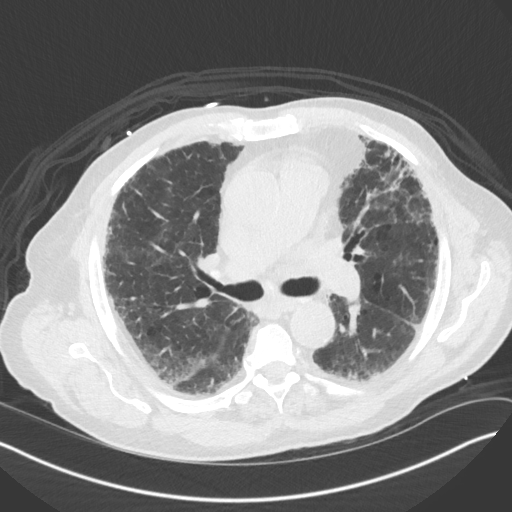
[im 108/156  lung]
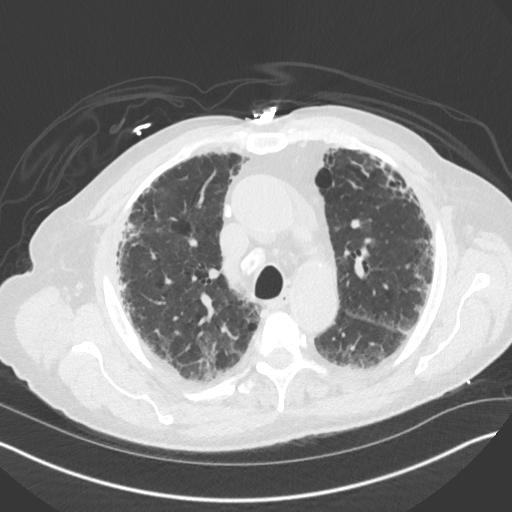
[im 122/156  mediastinal]
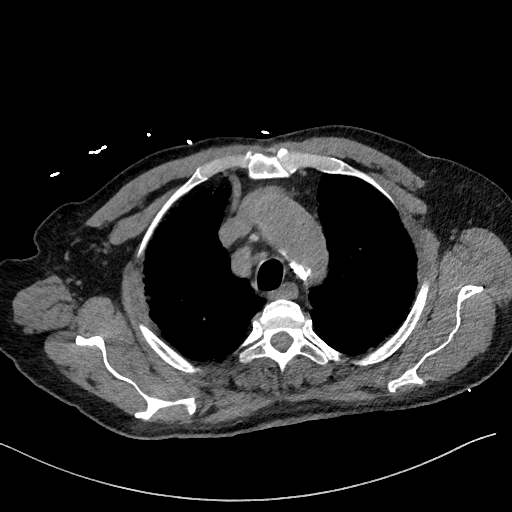
[im 122/156  lung]
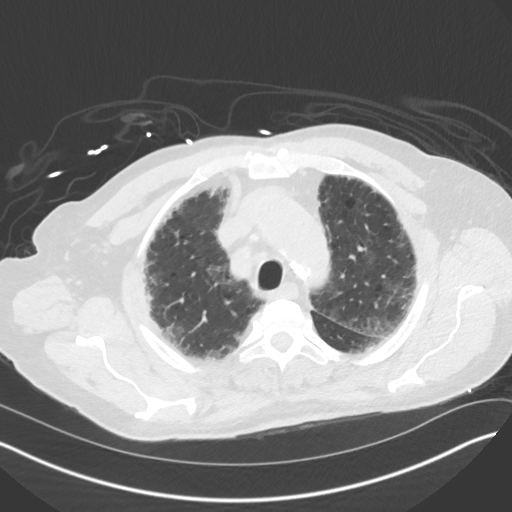
[im 135/156  lung]
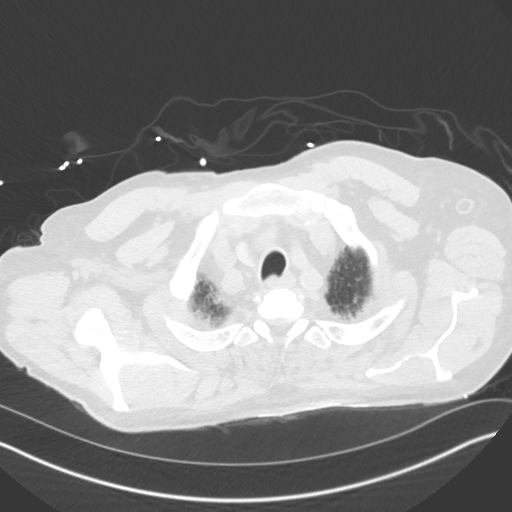
[im 149/156  lung]
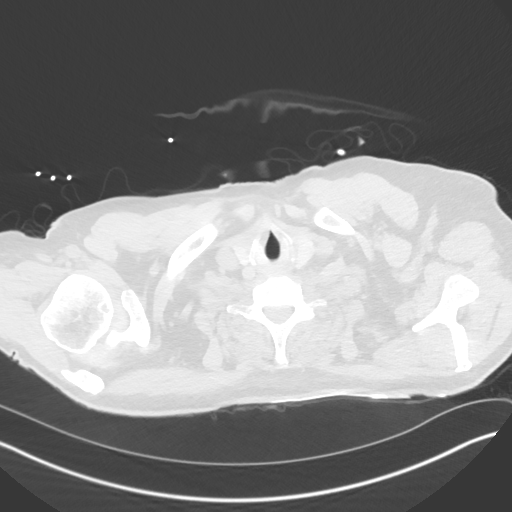

[Series 4: chest w/o 3mm st cor · coronal · non-contrast · 0.61mm/px · 3 of 91 slices shown]
[im 19/91  lung]
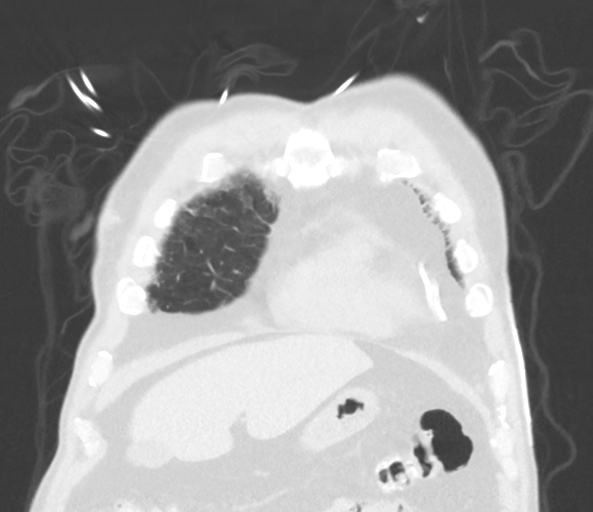
[im 37/91  lung]
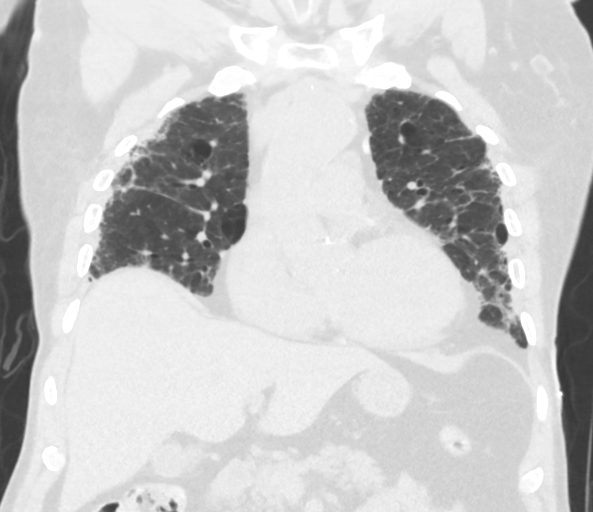
[im 55/91  lung]
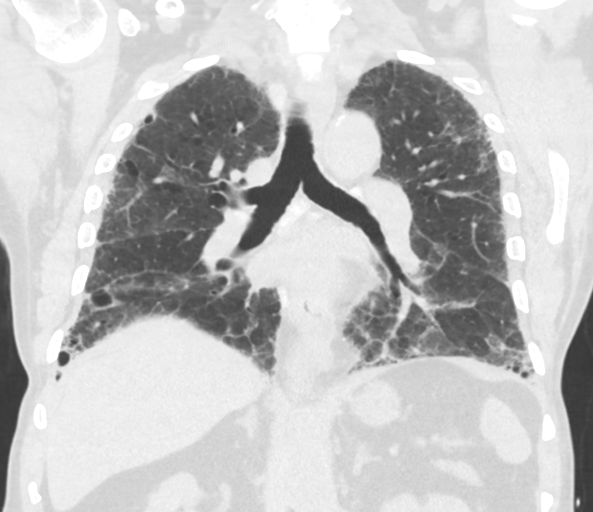

[14 of 36 positions shown; findings below may reference images not displayed]

FINDINGS: Cardiovascular: Heart size is mildly enlarged. There is no
significant pericardial fluid, thickening or pericardial
calcification. There is aortic atherosclerosis, as well as
atherosclerosis of the great vessels of the mediastinum and the
coronary arteries, including calcified atherosclerotic plaque in the
left main, left anterior descending, left circumflex and right
coronary arteries.

Mediastinum/Nodes: There are multiple borderline enlarged and mildly
enlarged mediastinal and bilateral hilar lymph nodes, most of which
demonstrate faint calcifications, largest of which measures up to
1.2 cm in short axis in the low right paratracheal nodal station.
Esophagus is unremarkable in appearance. No axillary
lymphadenopathy.

Lungs/Pleura: High-resolution images demonstrate widespread areas of
subpleural reticulation, parenchymal banding, traction
bronchiectasis and some honeycombing. These findings have a
definitive craniocaudal gradient. A few patchy areas of ground-glass
attenuation are also noted, most evident throughout the lower lobes
of the lungs bilaterally. In addition, there is diffuse bronchial
wall thickening with mild to moderate centrilobular emphysema. No
acute confluent consolidative airspace disease. No pleural
effusions. Inspiratory and expiratory imaging is unremarkable. No
definite suspicious appearing pulmonary nodules or masses are noted.

Upper Abdomen: Aortic atherosclerosis. Multiple small calcified
granulomas in the liver. Exophytic low-attenuation lesion extending
off the upper pole of the left kidney posteriorly measuring up to
2.8 cm in diameter and other smaller low-attenuation lesions in the
right kidney are incompletely characterized on today's noncontrast
CT examination, but favored to represent cysts.

Musculoskeletal: There are no aggressive appearing lytic or blastic
lesions noted in the visualized portions of the skeleton.
IMPRESSION: 1. The appearance of the chest is compatible with interstitial lung
disease, with overall imaging features favored to represent usual
interstitial pneumonia (UIP). Repeat high-resolution chest CT is
recommended in 12 months to assess for temporal changes in the
appearance of the lung parenchyma.
2. In addition, there is diffuse bronchial wall thickening with mild
to moderate centrilobular emphysema; imaging features suggesting
concurrent COPD.
3. Aortic atherosclerosis, in addition to left main and 3 vessel
coronary artery disease. Assessment for potential risk factor
modification, dietary therapy or pharmacologic therapy may be
warranted, if clinically indicated.
4. Mild cardiomegaly.
5. Additional incidental findings, as above.

## 2021-05-29 ENCOUNTER — Telehealth: Payer: Self-pay | Admitting: Internal Medicine

## 2021-05-29 NOTE — Telephone Encounter (Signed)
Norman Baker is your IPF patinet last seen in 2018 and no followup. NAme came up as research staff were looking at database. Wanted to alert you so if you want to bring him in for use you could  Thanks  MR

## 2021-05-29 NOTE — Telephone Encounter (Signed)
Patient was last seen in our office 11/18/2016 by Dr. Vassie Loll. Patient has IPF and was at the time on Ofev.

## 2021-06-01 NOTE — Telephone Encounter (Signed)
ATC patient to see if pt needs an appt. No answer and VM is full.

## 2021-06-03 NOTE — Telephone Encounter (Signed)
ATC patient again. No answer and VM is full.  °

## 2021-06-11 ENCOUNTER — Encounter: Payer: Self-pay | Admitting: Pulmonary Disease

## 2021-06-11 NOTE — Telephone Encounter (Signed)
ATC patient. VM is full. Will mail a letter and close encounter per protocol
